# Patient Record
Sex: Male | Born: 1970
Health system: Southern US, Community
[De-identification: ages and names within clinical notes are randomized; demographics above are authoritative.]

## PROBLEM LIST (undated history)

## (undated) DIAGNOSIS — I639 Cerebral infarction, unspecified: Secondary | ICD-10-CM

---

## 2006-06-14 ENCOUNTER — Ambulatory Visit: Payer: Self-pay | Admitting: Podiatry

## 2007-10-20 ENCOUNTER — Emergency Department: Payer: Self-pay | Admitting: Emergency Medicine

## 2008-02-06 ENCOUNTER — Ambulatory Visit: Payer: Self-pay

## 2008-02-25 ENCOUNTER — Ambulatory Visit: Payer: Self-pay

## 2008-11-19 IMAGING — CR RIGHT HAND - COMPLETE 3+ VIEW
1 series · 3 of 3 positions shown · non-contrast
Comparison: none

REASON FOR EXAM: lasting effect from stroke in 5881 dds [id number]
[REDACTED]
COMMENTS:

[Series 1: view not recorded · 0.17mm/px · 3 of 3 slices shown]
[im 1/3]
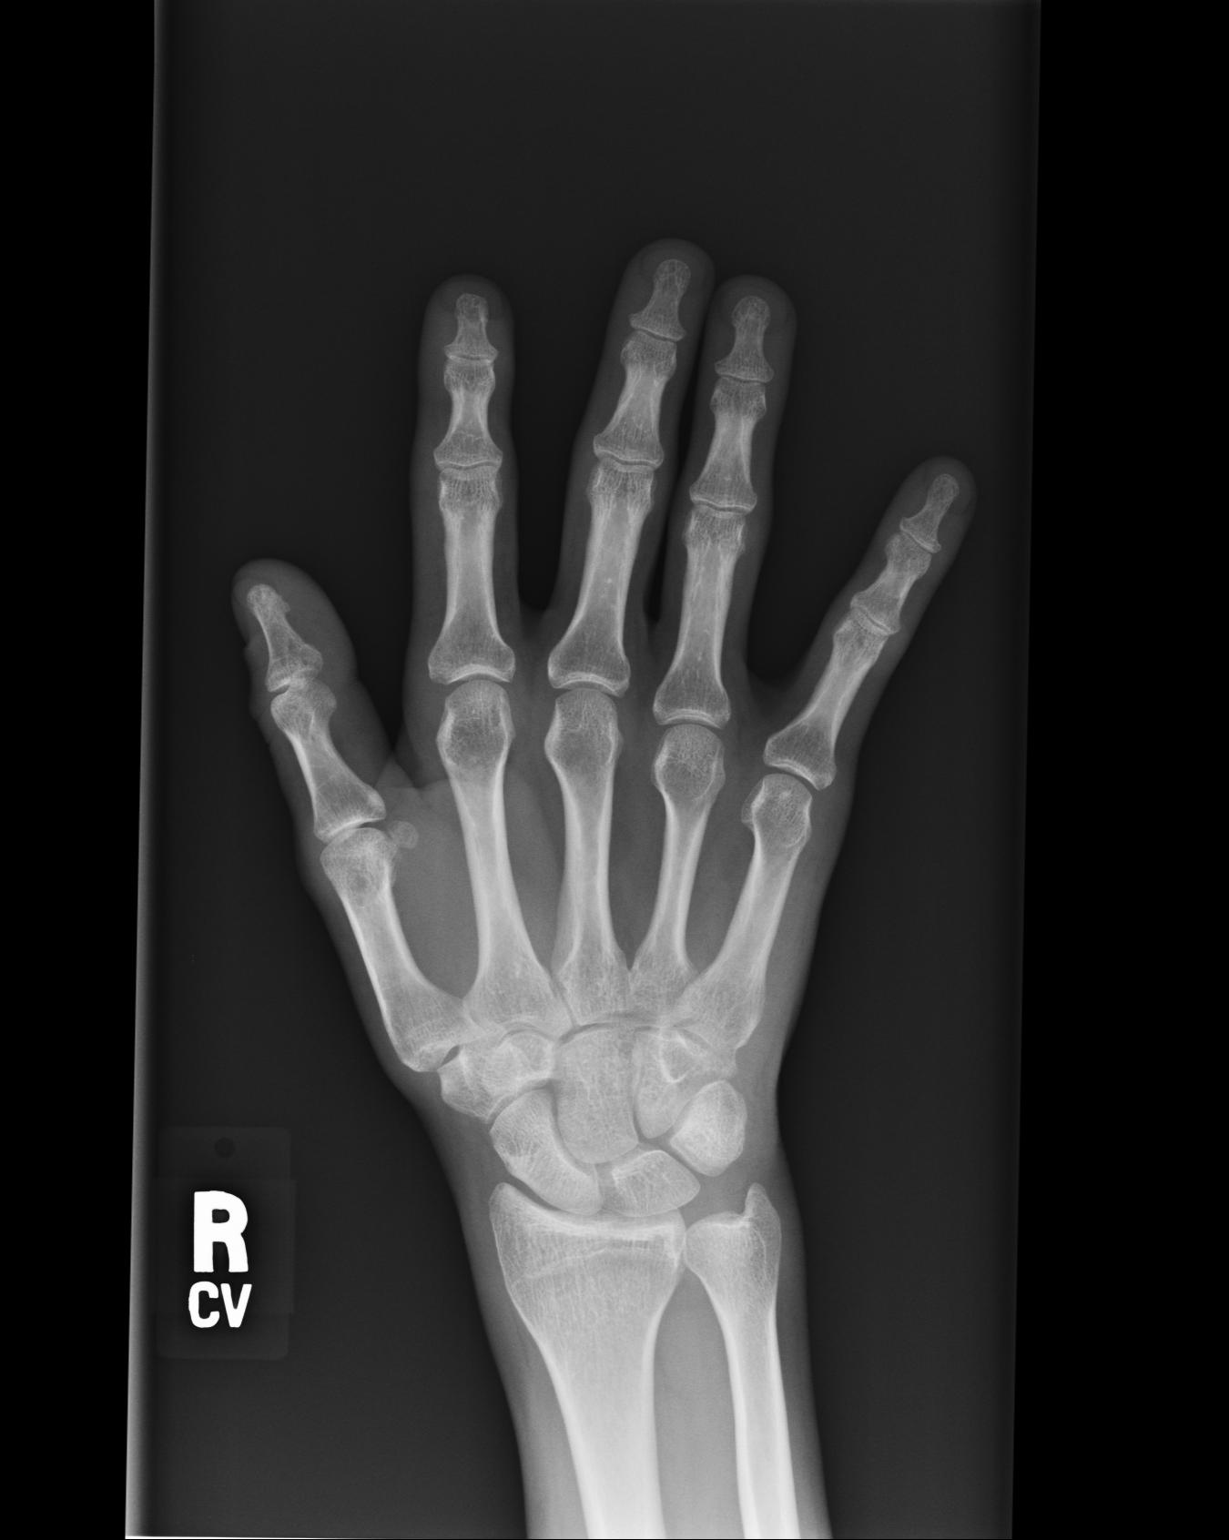
[im 2/3]
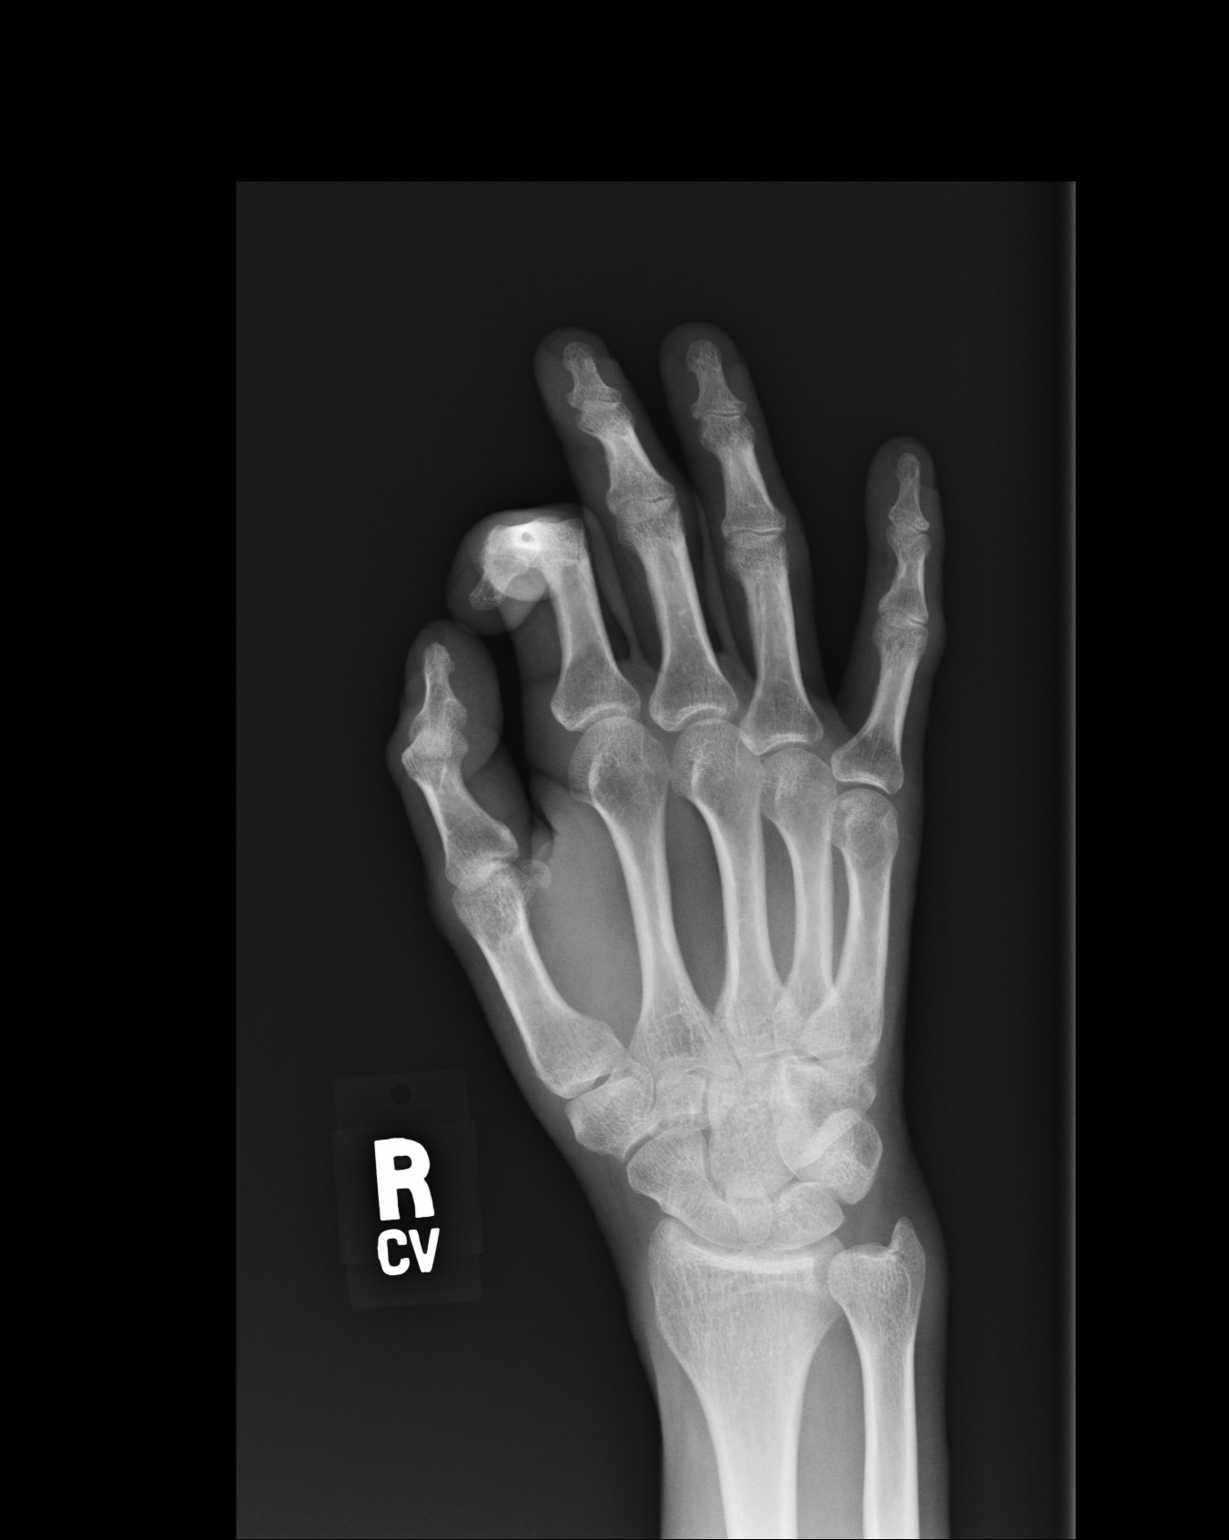
[im 3/3]
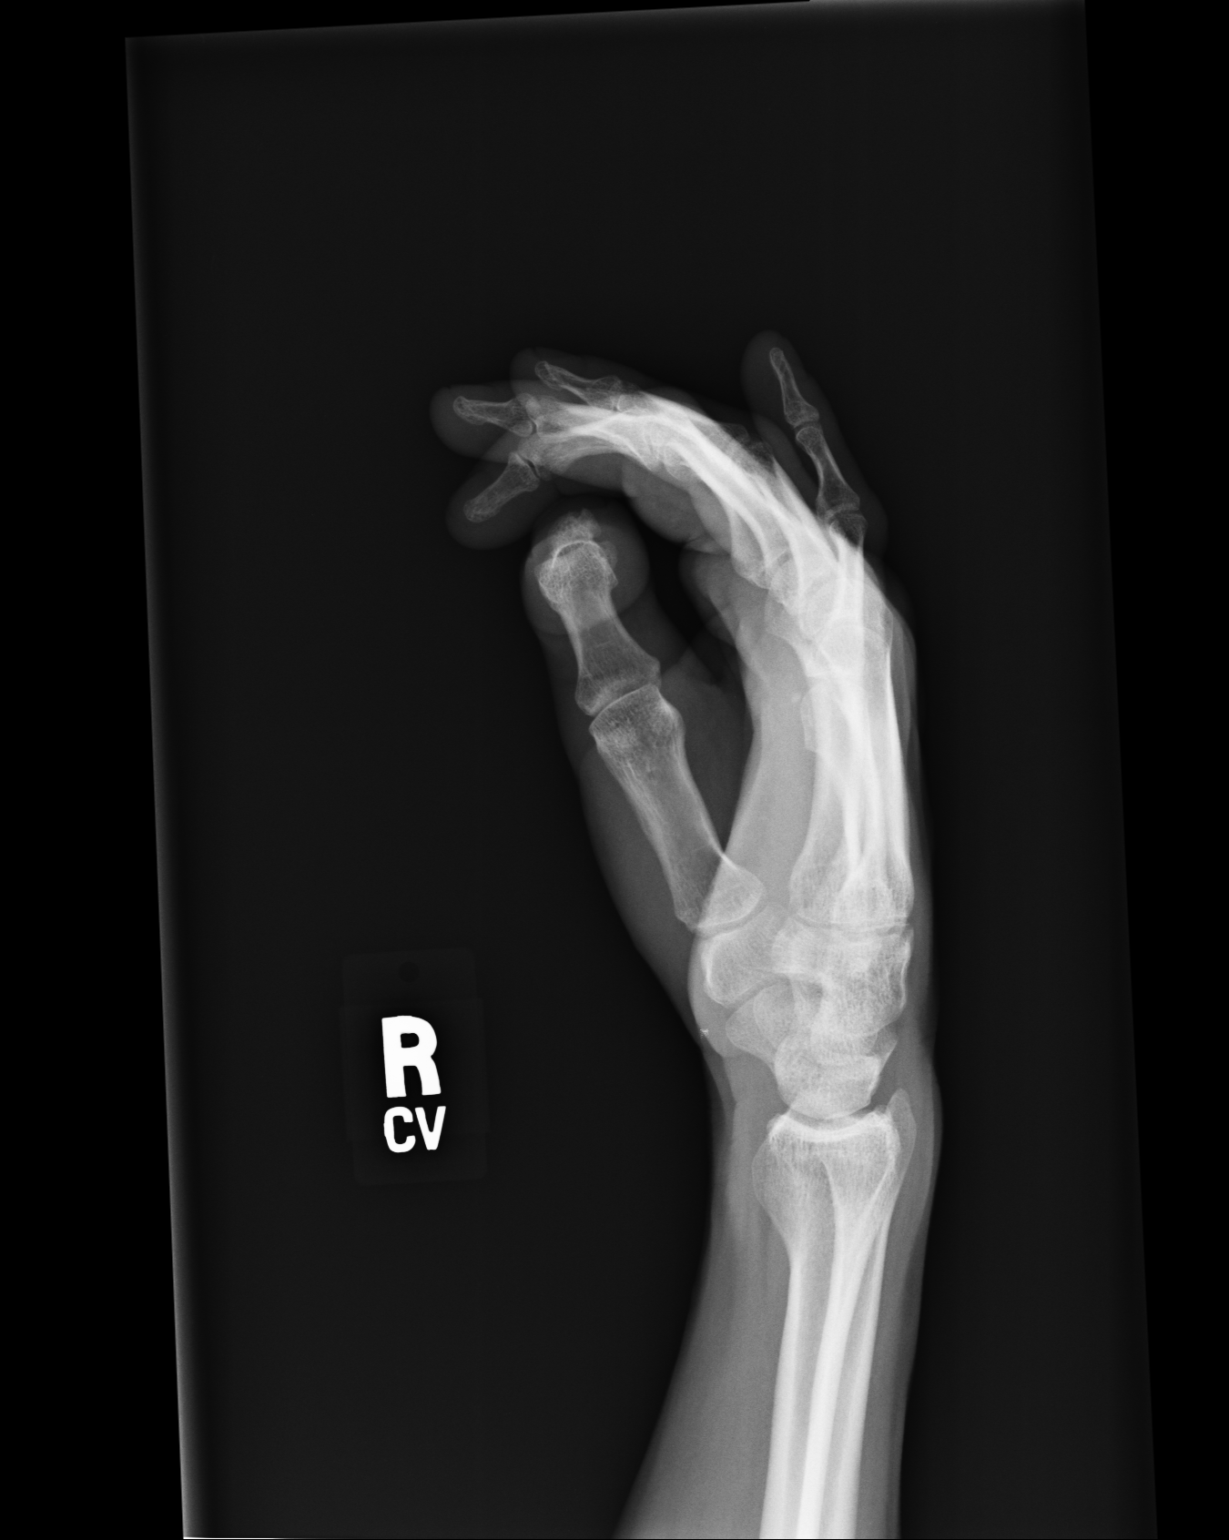

[3 of 3 positions shown; findings below may reference images not displayed]

PROCEDURE:     DXR - DXR HAND RT COMPLETE W/OBLIQUES  - February 25, 2008 [DATE]

RESULT:     Comparison is made to the prior exam of 02/06/2008. No fracture or
dislocation is seen. There is mild narrowing of the DIP and PIP joints
consistent with arthritic change and showing no progression since the prior
exam. No lytic or blastic lesions are noted.
IMPRESSION: 1. No fracture is seen.
2. Arthritic change is noted at the PIP and DIP joints. No progression is
seen as compared to the exam of 02/06/2008.
[DATE]. No lytic or blastic lesions are seen.

## 2008-11-19 IMAGING — CR DG KNEE 1-2V*R*
1 series · 2 of 2 positions shown · non-contrast
Comparison: none

REASON FOR EXAM: effect from stroke in 3521 dds [REDACTED]
[id number]
COMMENTS:

[Series 1: view not recorded · 0.17mm/px · 2 of 2 slices shown]
[im 1/2]
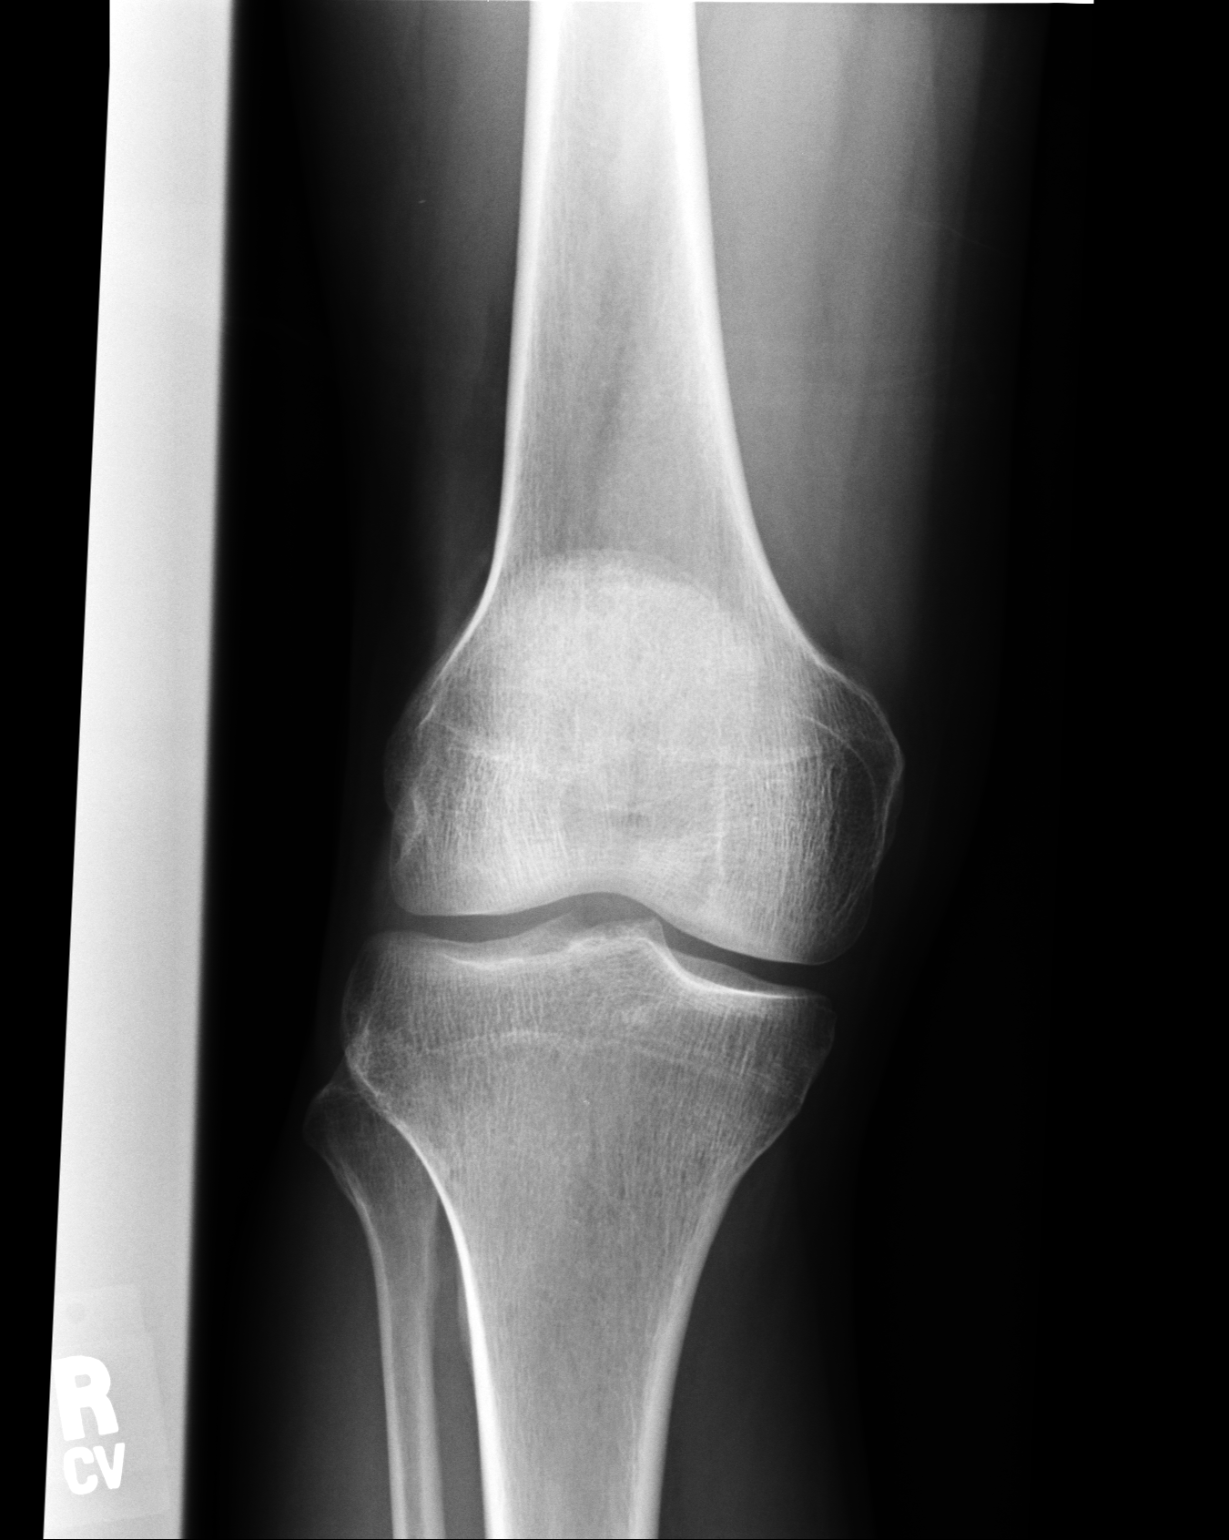
[im 2/2]
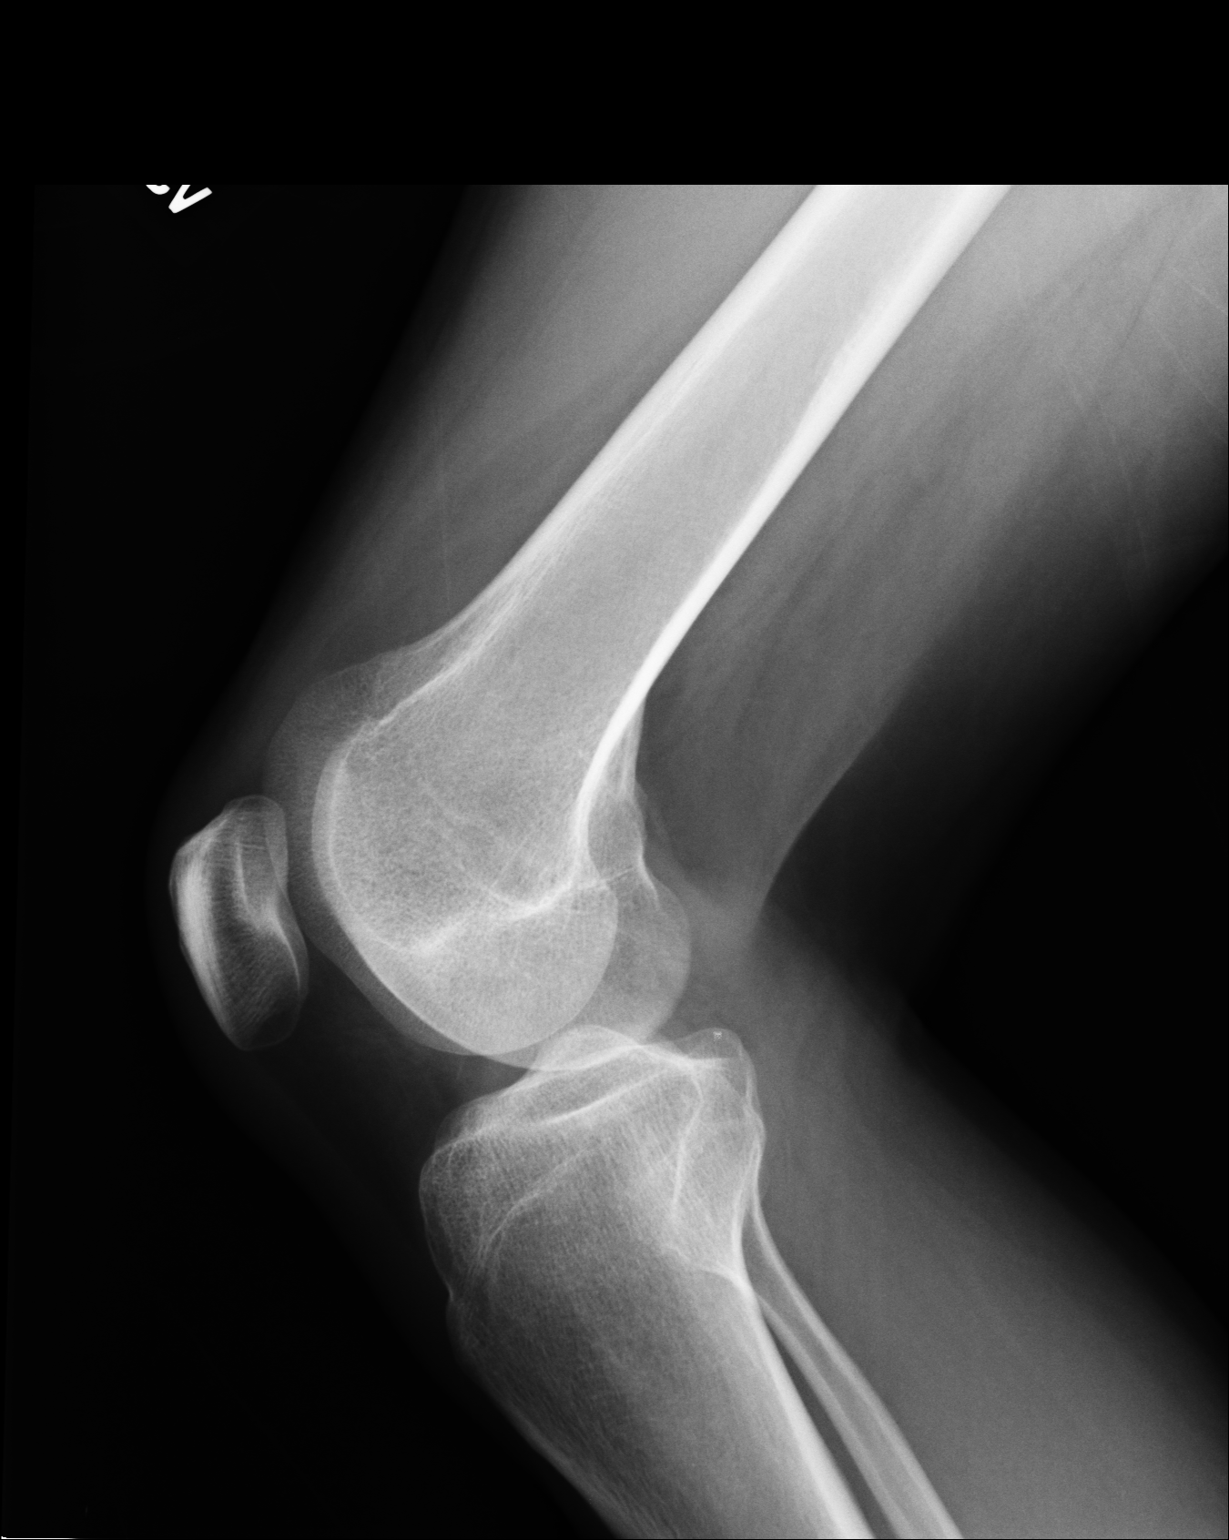

[2 of 2 positions shown; findings below may reference images not displayed]

PROCEDURE:     DXR - DXR KNEE RIGHT AP AND LATERAL  - February 25, 2008 [DATE]

RESULT:     No fracture, dislocation or other acute bony abnormality is
identified. The knee joint space is well maintained. The patella is intact.
Compared to the exam of 02/06/2008, no significant interval changes are seen.
IMPRESSION: No significant abnormalities are identified.

## 2009-07-01 ENCOUNTER — Encounter: Payer: Self-pay | Admitting: Internal Medicine

## 2009-07-08 ENCOUNTER — Encounter: Payer: Self-pay | Admitting: Internal Medicine

## 2012-12-17 ENCOUNTER — Observation Stay: Payer: Self-pay | Admitting: Internal Medicine

## 2012-12-17 LAB — CK TOTAL AND CKMB (NOT AT ARMC)
CK, Total: 154 U/L (ref 35–232)
CK-MB: 2.1 ng/mL (ref 0.5–3.6)

## 2012-12-17 LAB — BASIC METABOLIC PANEL
Anion Gap: 6 — ABNORMAL LOW (ref 7–16)
Calcium, Total: 9.1 mg/dL (ref 8.5–10.1)
Co2: 26 mmol/L (ref 21–32)
Creatinine: 0.83 mg/dL (ref 0.60–1.30)
EGFR (African American): >60
Osmolality: 276 (ref 275–301)
Potassium: 3.8 mmol/L (ref 3.5–5.1)
Sodium: 138 mmol/L (ref 136–145)

## 2012-12-17 LAB — CBC
HCT: 44.7 % (ref 40.0–52.0)
MCH: 31.3 pg (ref 26.0–34.0)
MCHC: 34.9 g/dL (ref 32.0–36.0)
MCV: 90 fL (ref 80–100)
Platelet: 213 10*3/uL (ref 150–440)
RBC: 4.99 10*6/uL (ref 4.40–5.90)
WBC: 8.4 10*3/uL (ref 3.8–10.6)

## 2012-12-18 DIAGNOSIS — R079 Chest pain, unspecified: Secondary | ICD-10-CM

## 2012-12-18 LAB — CBC WITH DIFFERENTIAL/PLATELET
Basophil %: 1.3 %
Eosinophil #: 0.4 10*3/uL (ref 0.0–0.7)
HCT: 42.7 % (ref 40.0–52.0)
HGB: 14.7 g/dL (ref 13.0–18.0)
Lymphocyte %: 30.8 %
MCH: 31.2 pg (ref 26.0–34.0)
MCHC: 34.5 g/dL (ref 32.0–36.0)
MCV: 90 fL (ref 80–100)
Monocyte #: 0.6 x10 3/mm (ref 0.2–1.0)
Monocyte %: 7.9 %
Neutrophil %: 54.9 %
RBC: 4.73 10*6/uL (ref 4.40–5.90)
RDW: 13.2 % (ref 11.5–14.5)
WBC: 7.1 10*3/uL (ref 3.8–10.6)

## 2012-12-18 LAB — URINALYSIS, COMPLETE
Bilirubin,UR: NEGATIVE
Blood: NEGATIVE
Glucose,UR: NEGATIVE mg/dL (ref 0–75)
Ketone: NEGATIVE
Ph: 5 (ref 4.5–8.0)
Protein: NEGATIVE
Specific Gravity: 1.054 (ref 1.003–1.030)
Squamous Epithelial: <1
WBC UR: <1 /HPF (ref 0–5)

## 2012-12-18 LAB — CK TOTAL AND CKMB (NOT AT ARMC)
CK, Total: 80 U/L (ref 35–232)
CK-MB: 1 ng/mL (ref 0.5–3.6)
CK-MB: 1.2 ng/mL (ref 0.5–3.6)

## 2012-12-18 LAB — BASIC METABOLIC PANEL
Anion Gap: 8 (ref 7–16)
BUN: 12 mg/dL (ref 7–18)
Co2: 25 mmol/L (ref 21–32)
Creatinine: 0.79 mg/dL (ref 0.60–1.30)
EGFR (African American): >60
Glucose: 97 mg/dL (ref 65–99)
Osmolality: 273 (ref 275–301)
Potassium: 3.8 mmol/L (ref 3.5–5.1)

## 2012-12-18 LAB — TROPONIN I: Troponin-I: <0.02 ng/mL

## 2012-12-18 LAB — LIPID PANEL
Cholesterol: 141 mg/dL (ref 0–200)
HDL Cholesterol: 68 mg/dL — ABNORMAL HIGH (ref 40–60)
Ldl Cholesterol, Calc: 50 mg/dL (ref 0–100)
Triglycerides: 117 mg/dL (ref 0–200)

## 2012-12-18 LAB — TSH: Thyroid Stimulating Horm: 3.09 u[IU]/mL

## 2013-01-14 ENCOUNTER — Emergency Department: Payer: Self-pay | Admitting: Emergency Medicine

## 2014-08-07 ENCOUNTER — Ambulatory Visit: Payer: Medicare Other | Admitting: Internal Medicine

## 2015-02-27 NOTE — H&P (Signed)
PATIENT NAME:  Adam Anderson, Adam Anderson MR#:  [id number] DATE OF BIRTH:  [birth year]  DATE OF ADMISSION:  12/17/2012  PRIMARY CARE PHYSICIAN:Non local  EMERGENCY ROOM PHYSICIAN:  Dr. Darnelle Catalan.   CHIEF COMPLAINT: Chest pain.   HISTORY OF PRESENT ILLNESS: The patient is a 44 year old male patient with history of stroke with residual right-sided weakness, came in because of chest pain. The patient started to have chest pain from right to the left across the chest, started this morning. Denies any nausea or vomiting. No trouble breathing. No cough. No fever. No sweating. The patient states that he also has upper back pain and lower back pain, unable to move the neck.   PAST MEDICAL HISTORY:  Significant for history of stroke.   ALLERGIES: No known allergies.   SOCIAL HISTORY: The patient smokes 1 pack per day for a long time. No alcohol. No drugs.   PAST SURGICAL HISTORY: No operations.   FAMILY HISTORY: No hypertension or diabetes.   MEDICATIONS:  None.  REVIEW OF SYSTEMS: CONSTITUTIONAL:  He has no fever. No fatigue.  EYES: No blurred vision.  EAR, NOSE, THROAT: No tinnitus. No ear pain. No epistaxis. No difficulty swallowing.  CARDIOVASCULAR: The patient does have some chest pain across the chest from right to left, like a bandlike sensation. Not radiating to the arms. Denies any trouble breathing. No palpitations. No syncope.  GASTROINTESTINAL: No nausea. No vomiting. No abdominal pain.   GENITOURINARY: No dysuria.  MUSCULOSKELETAL: Complains of upper back pain. NEUROLOGIC: The patient has history of stroke with residual right-sided weakness.  PSYCHIATRIC:  Somewhat anxious.  PHYSICAL EXAMINATION:  VITAL SIGNS:  Temperature is 98, heart rate 77, blood pressure is 118/58, respirations 18, sats 100% on room air.  GENERAL: Alert, awake, oriented. Slightly anxious.  HEAD AND EYES:  Atraumatic, normocephalic. Pupils equal, reacting to light. Extraocular movements intact.  EAR, NOSE, THROAT: No  tympanic membrane congestion. No turbinate hypertrophy. No oropharyngeal erythema.  NECK: Normal range of motion. No JVD.  No carotid bruits.    CARDIOVASCULAR:  S1, S2  regular.  No murmurs.  LUNGS:  Clear to auscultation. No wheeze. No rales.  ABDOMEN: Soft, nontender, nondistended. Bowel sounds present.  EXTREMITIES: No edema. No cyanosis. No clubbing.  NEUROLOGIC: The patient has slight weakness in the right upper and lower extremities due to previous stroke, otherwise within normal range power and motion on the left side. Speech is clear.   RADIOGRAPHIC AND LABORATORY DATA: Troponin less than 0.02. CT chest and abdomen did not show any pulmonary emboli. WBC 8.4, hemoglobin 15.6, hematocrit 44.7, platelets 213. Electrolytes:  Sodium 138, potassium  3.8, chloride 106, bicarb 26, BUN is 12, creatinine ,0.83. glucose 100. Troponin less than 0.02, CK 154, CPK-MB 2.1. EKG showed sinus brady at around 54 beats per minute.   ASSESSMENT AND PLAN:  A 44 year old male patient with:  1.  Chest pain, sounds like musculoskeletal with chest wall tenderness, but we will rule out myocardial infarction. Admit to hospitalist service on observation. Continue telemetry. CK, troponins  every 8hrs two more times with Lexiscan stress test in the morning. Continue aspirin, beta blockers, nitroglycerin. Obtain fasting lipids.  2.  Tobacco abuse. I counseled the patient for about 5 minutes about smoking cessation, and the patient not really interested at this time.  3.  Upper back pain, likely musculoskeletal. Continue ibuprofen and also Skelaxin and see how it helps.   TIME SPENT: About 50 minutes on history and physical.    ____________________________ Madelyn Flavors  Luberta MutterKonidena, MD sk:dm D: 12/17/2012 22:00:00 ET T: 12/17/2012 22:28:52 ET JOB#: [id number]  cc: Katha HammingSnehalatha Khalia Gong, MD, <Dictator> Katha HammingSNEHALATHA Natarsha Hurwitz MD ELECTRONICALLY SIGNED 01/08/2013 13:33

## 2015-02-27 NOTE — Discharge Summary (Signed)
PATIENT NAME:  Adam Anderson, Adam Anderson MR#:  [id number] DATE OF BIRTH:  1970-12-18  DATE OF ADMISSION:  12/17/2012 DATE OF DISCHARGE:  12/18/2012  ADMITTING DIAGNOSIS: Chest pain.   DISCHARGE DIAGNOSES:  1. Chest pain of unclear etiology at this time, likely noncardiac. Myoview is negative.  2. Back pain, likely musculoskeletal.  3. Hyperglycemia, resolved.  4. Tobacco abuse.  5. History of stroke with right-sided weakness.   DISCHARGE CONDITION: Stable.   DISCHARGE MEDICATIONS: The patient is to start acetaminophen/oxycodone 325/5 mg 1 tablet every 4 hours as needed, aspirin 81 mg p.o. daily, Skelaxin 800 mg p.o. every 8 hours. He is not to take Bactrim.   HOME OXYGEN: None.   DIET: A 2 gram salt, low fat, low cholesterol, regular consistency.   ACTIVITY LIMITATIONS: As tolerated.   FOLLOWUP APPOINTMENT: With Dr. Juel Burrow in 2 days after discharge.    CONSULTANTS: None.   RADIOLOGIC STUDIES: Chest x-ray, PA and lateral, 12/17/2012 showed no acute cardiopulmonary disease. CT scan of chest, abdomen, as well as pelvis with contrast 12/17/2012 revealed no acute cardiopulmonary disease. No infiltrate, effusion or pneumothorax. No evidence of pulmonary embolus or filling defect. No thoracic aortic aneurysm or dissection. Minimal nodularity in the superior region in the lateral segment of the right middle lobe was noted. Myoview stress test, read by Dr. Mariah Milling, negative for coronary ischemia.   HISTORY OF PRESENT ILLNESS: The patient is a 44 year old Caucasian male with past medical history significant for history of stroke in the past, history of tobacco abuse ongoing who presented to the hospital on 12/17/2012 with complaints of chest pains. Please refer to Dr. Suzanne Boron admission note on 12/17/2012.   PHYSICAL EXAMINATION: On arrival to the Emergency Room, the patient's temperature was 98, pulse was 77, respiratory rate was 18, blood pressure was 118/58, oxygen saturation was 100% on room air.  Physical exam was remarkable for weakness in the right upper extremity as well as right lower extremity due to previous stroke. Otherwise, no significant abnormalities.   LABORATORY DATA: The patient's EKG done in the Emergency Room showed sinus brady at around 54 beats per minute. No acute ST-T changes were noted. The patient's lab data on 12/17/2012 showed elevated glucose to 100, otherwise BMP was unremarkable. The patient's liver enzymes were not checked. The patient's cardiac enzymes x4 did not show any abnormalities. TSH was normal at 3.09. The patient's white blood cell count was normal at 8.4, hemoglobin was 15.6, platelet count 213. D-dimer was less than 0.22. Urinalysis: Amber hazy urine, negative for glucose, bilirubin or ketones, specific gravity was 1.054, pH was 5.0, negative for blood, protein, nitrites or leukocyte esterase, 7 red blood cells, less than 1 white blood cell, no bacteria, less than 1 epithelial cell. No mucus or calcium oxalate crystals were present.   HOSPITAL COURSE:  1. The patient was admitted to the hospital for further evaluation. He was started on some pain medications, opiates, for his pain and stress test of the heart was ordered for 12/18/2012. The patient underwent cardiac stress test procedure on February 11th, Lexiscan Myoview. The patient's baseline EKG revealed right bundle branch block with no significant ST or T wave abnormalities. The patient had no chest pains during excercise , and his Myoview images were normal.  2. The patient was noted to have back pains. It was felt that the patient's back pain likely was musculoskeletal. CT scan of his chest showed no significant abnormalities, bony or lung abnormalities. The patient was advised to continue Skelaxin  as well as Percocet as needed.  3. Opiates. The patient was noted to be bradycardic. TSH was checked and was found to be within normal limits. The patient had no abnormalities in his blood pressure during even  significant bradycardia with heart rate of 45. It was felt that the patient is to continue to follow up with his primary care physician for further recommendations.  4. The patient was noted to be hyperglycemic; however, fasting blood glucose level was below 100.  5. For tobacco abuse, the patient was counseled; however, he refused any tobacco replacement therapy.  6. For history of stroke, no significant abnormalities where seen. The patient's right-sided weakness was stable, chronic.   The patient is being discharged in stable condition with the above-mentioned medications and followup. Vital signs of the day of discharge: Temperature was 97.4, pulse was ranging from 47 to 80s, respiratory rate was 18, blood pressure was 114/75, saturation was 99% on room air at rest.   TIME SPENT: 40 minutes.    ____________________________ Katharina Caperima Steele Stracener, MD rv:gb D: 12/18/2012 17:41:19 ET T: 12/19/2012 00:36:20 ET JOB#: [id number]  cc: Katharina Caperima Lasonia Casino, MD, <Dictator> Corky DownsJaved Masoud, MD Kaizer Dissinger MD ELECTRONICALLY SIGNED 12/29/2012 20:42

## 2015-05-13 ENCOUNTER — Encounter: Payer: Self-pay | Admitting: Emergency Medicine

## 2015-05-13 ENCOUNTER — Emergency Department
Admission: EM | Admit: 2015-05-13 | Discharge: 2015-05-13 | Disposition: A | Discharge status: Home / Self Care | Payer: Medicare HMO | Attending: Emergency Medicine | Admitting: Emergency Medicine

## 2015-05-13 ENCOUNTER — Emergency Department: Payer: Medicare HMO

## 2015-05-13 DIAGNOSIS — Y288XXA Contact with other sharp object, undetermined intent, initial encounter: Secondary | ICD-10-CM | POA: Insufficient documentation

## 2015-05-13 DIAGNOSIS — Y998 Other external cause status: Secondary | ICD-10-CM | POA: Diagnosis not present

## 2015-05-13 DIAGNOSIS — S91115A Laceration without foreign body of left lesser toe(s) without damage to nail, initial encounter: Secondary | ICD-10-CM | POA: Diagnosis not present

## 2015-05-13 DIAGNOSIS — Y9289 Other specified places as the place of occurrence of the external cause: Secondary | ICD-10-CM | POA: Diagnosis not present

## 2015-05-13 DIAGNOSIS — Y9389 Activity, other specified: Secondary | ICD-10-CM | POA: Diagnosis not present

## 2015-05-13 DIAGNOSIS — Z720 Tobacco use: Secondary | ICD-10-CM | POA: Diagnosis not present

## 2015-05-13 DIAGNOSIS — S81812A Laceration without foreign body, left lower leg, initial encounter: Secondary | ICD-10-CM | POA: Diagnosis not present

## 2015-05-13 DIAGNOSIS — S99922A Unspecified injury of left foot, initial encounter: Secondary | ICD-10-CM | POA: Diagnosis present

## 2015-05-13 MED ORDER — CLOTRIMAZOLE 1 % EX CREA
TOPICAL_CREAM | Freq: Once | CUTANEOUS | Status: DC
  Filled 2015-05-13: qty 15

## 2015-05-13 MED ORDER — TETANUS-DIPHTH-ACELL PERTUSSIS 5-2.5-18.5 LF-MCG/0.5 IM SUSP
INTRAMUSCULAR | Status: AC
  Administered 2015-05-13: 0.5 mL via INTRAMUSCULAR
  Filled 2015-05-13: qty 0.5

## 2015-05-13 MED ORDER — TETANUS-DIPHTH-ACELL PERTUSSIS 5-2.5-18.5 LF-MCG/0.5 IM SUSP
0.5000 mL | Freq: Once | INTRAMUSCULAR | Status: AC
Start: 2015-05-13 — End: 2015-05-13
  Administered 2015-05-13: 0.5 mL via INTRAMUSCULAR

## 2015-05-13 MED ORDER — OXYCODONE-ACETAMINOPHEN 7.5-325 MG PO TABS
1.0000 | ORAL_TABLET | Freq: Four times a day (QID) | ORAL | Status: DC | PRN
Start: 2015-05-13 — End: 2018-09-19

## 2015-05-13 MED ORDER — BACITRACIN ZINC 500 UNIT/GM EX OINT
TOPICAL_OINTMENT | CUTANEOUS | Status: AC
  Filled 2015-05-13: qty 1.8

## 2015-05-13 MED ORDER — MICONAZOLE NITRATE 2 % EX CREA
TOPICAL_CREAM | Freq: Once | CUTANEOUS | Status: DC
  Filled 2015-05-13: qty 14

## 2015-05-13 MED ORDER — LIDOCAINE-EPINEPHRINE (PF) 1 %-1:200000 IJ SOLN
INTRAMUSCULAR | Status: AC
  Administered 2015-05-13: 30 mL
  Filled 2015-05-13: qty 30

## 2015-05-13 MED ORDER — LIDOCAINE HCL (PF) 1 % IJ SOLN
INTRAMUSCULAR | Status: AC
  Filled 2015-05-13: qty 5

## 2015-05-13 MED ORDER — OXYCODONE-ACETAMINOPHEN 5-325 MG PO TABS
1.0000 | ORAL_TABLET | Freq: Once | ORAL | Status: AC
  Administered 2015-05-13: 1 via ORAL

## 2015-05-13 MED ORDER — LIDOCAINE HCL (PF) 1 % IJ SOLN
INTRAMUSCULAR | Status: AC
  Administered 2015-05-13: 5 mL
  Filled 2015-05-13: qty 5

## 2015-05-13 NOTE — ED Notes (Signed)
Pt with laceration to left lower leg and toe on left foot with saw.

## 2015-05-13 NOTE — ED Provider Notes (Signed)
[facility] Emergency Department Provider Note  ____________________________________________  Time seen: Approximately 9:17 AM  I have reviewed the triage vital signs and the nursing notes.   HISTORY  Chief Complaint Extremity Laceration    HPI Adam Anderson is a 44 y.o. male she will for lacerations the left lower leg in the third toe on the left foot. Patient stated there is a metal cut by a drill bit. Patient state hemorrhage is controlled direct pressure. Patient denies any loss sensation or loss of function of the left lower extremity. Patient rated his pain as a 5/10 describe the sharp. Except for pressure to control of wound bleeding no palliative measures performed.   History reviewed. No pertinent past medical history.  There are no active problems to display for this patient.   History reviewed. No pertinent past surgical history.  No current outpatient prescriptions on file.  Allergies Review of patient's allergies indicates no known allergies.  No family history on file.  Social History History  Substance Use Topics  . Smoking status: Current Every Day Smoker  . Smokeless tobacco: Not on file  . Alcohol Use: Yes    Review of Systems Constitutional: No fever/chills Eyes: No visual changes. ENT: No sore throat. Cardiovascular: Denies chest pain. Respiratory: Denies shortness of breath. Gastrointestinal: No abdominal pain.  No nausea, no vomiting.  No diarrhea.  No constipation. Genitourinary: Negative for dysuria. Musculoskeletal: Negative for back pain. Skin: Negative for rash. Laceration to the left leg and the third toe left foot. Neurological: Negative for headaches, focal weakness or numbness. 10-point ROS otherwise negative.  ____________________________________________   PHYSICAL EXAM:  VITAL SIGNS: ED Triage Vitals  Enc Vitals Group     BP 05/13/15 0912 131/72 mmHg     Pulse Rate 05/13/15 0912 75     Resp  05/13/15 0912 22     Temp 05/13/15 0912 97.8 F (36.6 C)     Temp Source 05/13/15 0912 Oral     SpO2 05/13/15 0912 100 %     Weight 05/13/15 0910 132 lb (59.875 kg)     Height 05/13/15 0910  (1.727 m)     Head Cir --      Peak Flow --      Pain Score 05/13/15 0911 5     Pain Loc --      Pain Edu? --      Excl. in GC? --     Constitutional: Alert and oriented. Well appearing and in no acute distress. Eyes: Conjunctivae are normal. PERRL. EOMI. Head: Atraumatic. Nose: No congestion/rhinnorhea. Mouth/Throat: Mucous membranes are moist.  Oropharynx non-erythematous. Neck: No stridor.  No cervical spine tenderness to palpation. Hematological/Lymphatic/Immunilogical: No cervical lymphadenopathy. Cardiovascular: Normal rate, regular rhythm. Grossly normal heart sounds.  Good peripheral circulation. Respiratory: Normal respiratory effort.  No retractions. Lungs CTAB. Gastrointestinal: Soft and nontender. No distention. No abdominal bruits. No CVA tenderness. Musculoskeletal: No lower extremity tenderness nor edema.  No joint effusions. Neurologic:  Normal speech and language. No gross focal neurologic deficits are appreciated. Speech is normal. No gait instability. Skin:  Skin is warm, dry and intact. No rash noted. 3 cm laceration to the left lower leg. 3 cm laceration to the third digit left foot on the dorsal aspect. Hemorrhages is controlled. No nuchal range of motion of the extremities sensation is intact. Psychiatric: Mood and affect are normal. Speech and behavior are normal.  ____________________________________________   LABS (all labs ordered are listed, but only abnormal  results are displayed)  Labs Reviewed - No data to display ____________________________________________  EKG   ____________________________________________  RADIOLOGY  No acute finding on x-ray. ____________________________________________   PROCEDURES  Procedure(s) performed: See procedure  note  Critical Care performed: No  ___________LACERATION REPAIR Performed by: Joni Reiningonald K Mikhael Hendriks Authorized by: Joni Reiningonald K Fronie Holstein Consent: Verbal consent obtained. Risks and benefits: risks, benefits and alternatives were discussed Consent given by: patient Patient identity confirmed: provided demographic data Prepped and Draped in normal sterile fashion Wound explored  Laceration Location: Left lower leg and third digit dorsal aspect left foot.  Laceration Length: Total 6 cm   No Foreign Bodies seen or palpated  Anesthesia: local infiltration for the left lower leg in a digital block to the third digit left foot.   Local anesthetic: Lidocaine 1% with epinephrine was used in the left lower leg. Lidocaine 1% without epinephrine was used for digital block.   Anesthetic total: 6 mL of lidocaine epinephrine was used left lower leg. And a total of 6 mL of lidocaine without epinephrine was used for digital block. Irrigation method: syringe Amount of cleaning: standard  Skin closure: 3-0 nylon was used for the left lower leg. 40 proline was use for the left toe. Number of sutures: 8 sutures were used for the left leg. 8 sutures were used for the toe. Total 16   Technique: Interrupted Patient tolerance: Patient tolerated the procedure well with no immediate complications. _________________________________   INITIAL IMPRESSION / ASSESSMENT AND PLAN / ED COURSE  Pertinent labs & imaging results that were available during my care of the patient were reviewed by me and considered in my medical decision making (see chart for details). Laceration to the left lower leg and also lacerates the dose aspect of the third digit left foot. Wounds were sutured and patient given advice on home care. Patient advised return by ER 10 days for suture removal. ____________________________________________   FINAL CLINICAL IMPRESSION(S) / ED DIAGNOSES  Final diagnoses:  Leg laceration, left, initial  encounter  Laceration of third toe, left, initial encounter      Joni ReiningRonald K Lillyana Majette, PA-C 05/13/15 [address]1051  Anyi Fels K Deshon Koslowski, PA-C 05/13/15 1147  Sharman CheekPhillip Stafford, MD 05/13/15 [phone]

## 2016-01-06 DIAGNOSIS — L700 Acne vulgaris: Secondary | ICD-10-CM | POA: Diagnosis not present

## 2016-03-08 DIAGNOSIS — R079 Chest pain, unspecified: Secondary | ICD-10-CM | POA: Diagnosis not present

## 2016-03-08 DIAGNOSIS — I6990 Unspecified sequelae of unspecified cerebrovascular disease: Secondary | ICD-10-CM | POA: Diagnosis not present

## 2016-03-09 DIAGNOSIS — A64 Unspecified sexually transmitted disease: Secondary | ICD-10-CM | POA: Diagnosis not present

## 2016-03-09 DIAGNOSIS — E784 Other hyperlipidemia: Secondary | ICD-10-CM | POA: Diagnosis not present

## 2016-03-09 DIAGNOSIS — R5381 Other malaise: Secondary | ICD-10-CM | POA: Diagnosis not present

## 2016-03-09 DIAGNOSIS — I10 Essential (primary) hypertension: Secondary | ICD-10-CM | POA: Diagnosis not present

## 2016-03-14 DIAGNOSIS — R079 Chest pain, unspecified: Secondary | ICD-10-CM | POA: Diagnosis not present

## 2016-03-14 DIAGNOSIS — I6990 Unspecified sequelae of unspecified cerebrovascular disease: Secondary | ICD-10-CM | POA: Diagnosis not present

## 2016-03-22 DIAGNOSIS — M545 Low back pain: Secondary | ICD-10-CM | POA: Diagnosis not present

## 2016-04-22 DIAGNOSIS — M9901 Segmental and somatic dysfunction of cervical region: Secondary | ICD-10-CM | POA: Diagnosis not present

## 2016-04-22 DIAGNOSIS — M62830 Muscle spasm of back: Secondary | ICD-10-CM | POA: Diagnosis not present

## 2016-04-22 DIAGNOSIS — M5412 Radiculopathy, cervical region: Secondary | ICD-10-CM | POA: Diagnosis not present

## 2016-04-22 DIAGNOSIS — M9902 Segmental and somatic dysfunction of thoracic region: Secondary | ICD-10-CM | POA: Diagnosis not present

## 2016-04-25 DIAGNOSIS — M9901 Segmental and somatic dysfunction of cervical region: Secondary | ICD-10-CM | POA: Diagnosis not present

## 2016-04-25 DIAGNOSIS — M9902 Segmental and somatic dysfunction of thoracic region: Secondary | ICD-10-CM | POA: Diagnosis not present

## 2016-04-25 DIAGNOSIS — M5412 Radiculopathy, cervical region: Secondary | ICD-10-CM | POA: Diagnosis not present

## 2016-04-25 DIAGNOSIS — M62830 Muscle spasm of back: Secondary | ICD-10-CM | POA: Diagnosis not present

## 2016-04-27 DIAGNOSIS — M9902 Segmental and somatic dysfunction of thoracic region: Secondary | ICD-10-CM | POA: Diagnosis not present

## 2016-04-27 DIAGNOSIS — M5412 Radiculopathy, cervical region: Secondary | ICD-10-CM | POA: Diagnosis not present

## 2016-04-27 DIAGNOSIS — M62830 Muscle spasm of back: Secondary | ICD-10-CM | POA: Diagnosis not present

## 2016-04-27 DIAGNOSIS — M9901 Segmental and somatic dysfunction of cervical region: Secondary | ICD-10-CM | POA: Diagnosis not present

## 2016-04-28 DIAGNOSIS — M9902 Segmental and somatic dysfunction of thoracic region: Secondary | ICD-10-CM | POA: Diagnosis not present

## 2016-04-28 DIAGNOSIS — M9901 Segmental and somatic dysfunction of cervical region: Secondary | ICD-10-CM | POA: Diagnosis not present

## 2016-04-28 DIAGNOSIS — M5412 Radiculopathy, cervical region: Secondary | ICD-10-CM | POA: Diagnosis not present

## 2016-04-28 DIAGNOSIS — M62830 Muscle spasm of back: Secondary | ICD-10-CM | POA: Diagnosis not present

## 2016-05-02 DIAGNOSIS — M62830 Muscle spasm of back: Secondary | ICD-10-CM | POA: Diagnosis not present

## 2016-05-02 DIAGNOSIS — M9901 Segmental and somatic dysfunction of cervical region: Secondary | ICD-10-CM | POA: Diagnosis not present

## 2016-05-02 DIAGNOSIS — M9902 Segmental and somatic dysfunction of thoracic region: Secondary | ICD-10-CM | POA: Diagnosis not present

## 2016-05-02 DIAGNOSIS — M5412 Radiculopathy, cervical region: Secondary | ICD-10-CM | POA: Diagnosis not present

## 2016-05-04 DIAGNOSIS — M62830 Muscle spasm of back: Secondary | ICD-10-CM | POA: Diagnosis not present

## 2016-05-04 DIAGNOSIS — M9901 Segmental and somatic dysfunction of cervical region: Secondary | ICD-10-CM | POA: Diagnosis not present

## 2016-05-04 DIAGNOSIS — M5412 Radiculopathy, cervical region: Secondary | ICD-10-CM | POA: Diagnosis not present

## 2016-05-04 DIAGNOSIS — M9902 Segmental and somatic dysfunction of thoracic region: Secondary | ICD-10-CM | POA: Diagnosis not present

## 2016-05-12 DIAGNOSIS — M9902 Segmental and somatic dysfunction of thoracic region: Secondary | ICD-10-CM | POA: Diagnosis not present

## 2016-05-12 DIAGNOSIS — M62830 Muscle spasm of back: Secondary | ICD-10-CM | POA: Diagnosis not present

## 2016-05-12 DIAGNOSIS — M9901 Segmental and somatic dysfunction of cervical region: Secondary | ICD-10-CM | POA: Diagnosis not present

## 2016-05-12 DIAGNOSIS — M5412 Radiculopathy, cervical region: Secondary | ICD-10-CM | POA: Diagnosis not present

## 2016-05-13 DIAGNOSIS — M5412 Radiculopathy, cervical region: Secondary | ICD-10-CM | POA: Diagnosis not present

## 2016-05-13 DIAGNOSIS — M9902 Segmental and somatic dysfunction of thoracic region: Secondary | ICD-10-CM | POA: Diagnosis not present

## 2016-05-13 DIAGNOSIS — M9901 Segmental and somatic dysfunction of cervical region: Secondary | ICD-10-CM | POA: Diagnosis not present

## 2016-05-13 DIAGNOSIS — M62830 Muscle spasm of back: Secondary | ICD-10-CM | POA: Diagnosis not present

## 2016-05-16 DIAGNOSIS — M9902 Segmental and somatic dysfunction of thoracic region: Secondary | ICD-10-CM | POA: Diagnosis not present

## 2016-05-16 DIAGNOSIS — M9901 Segmental and somatic dysfunction of cervical region: Secondary | ICD-10-CM | POA: Diagnosis not present

## 2016-05-16 DIAGNOSIS — M5412 Radiculopathy, cervical region: Secondary | ICD-10-CM | POA: Diagnosis not present

## 2016-05-16 DIAGNOSIS — M62830 Muscle spasm of back: Secondary | ICD-10-CM | POA: Diagnosis not present

## 2016-05-18 DIAGNOSIS — M9901 Segmental and somatic dysfunction of cervical region: Secondary | ICD-10-CM | POA: Diagnosis not present

## 2016-05-18 DIAGNOSIS — M62830 Muscle spasm of back: Secondary | ICD-10-CM | POA: Diagnosis not present

## 2016-05-18 DIAGNOSIS — M5412 Radiculopathy, cervical region: Secondary | ICD-10-CM | POA: Diagnosis not present

## 2016-05-18 DIAGNOSIS — M9902 Segmental and somatic dysfunction of thoracic region: Secondary | ICD-10-CM | POA: Diagnosis not present

## 2016-05-19 DIAGNOSIS — M62830 Muscle spasm of back: Secondary | ICD-10-CM | POA: Diagnosis not present

## 2016-05-19 DIAGNOSIS — M9902 Segmental and somatic dysfunction of thoracic region: Secondary | ICD-10-CM | POA: Diagnosis not present

## 2016-05-19 DIAGNOSIS — M5412 Radiculopathy, cervical region: Secondary | ICD-10-CM | POA: Diagnosis not present

## 2016-05-19 DIAGNOSIS — M9901 Segmental and somatic dysfunction of cervical region: Secondary | ICD-10-CM | POA: Diagnosis not present

## 2016-05-24 DIAGNOSIS — M9902 Segmental and somatic dysfunction of thoracic region: Secondary | ICD-10-CM | POA: Diagnosis not present

## 2016-05-24 DIAGNOSIS — M5412 Radiculopathy, cervical region: Secondary | ICD-10-CM | POA: Diagnosis not present

## 2016-05-24 DIAGNOSIS — M9901 Segmental and somatic dysfunction of cervical region: Secondary | ICD-10-CM | POA: Diagnosis not present

## 2016-05-24 DIAGNOSIS — M62830 Muscle spasm of back: Secondary | ICD-10-CM | POA: Diagnosis not present

## 2016-05-26 DIAGNOSIS — M9901 Segmental and somatic dysfunction of cervical region: Secondary | ICD-10-CM | POA: Diagnosis not present

## 2016-05-26 DIAGNOSIS — M5412 Radiculopathy, cervical region: Secondary | ICD-10-CM | POA: Diagnosis not present

## 2016-05-26 DIAGNOSIS — M62830 Muscle spasm of back: Secondary | ICD-10-CM | POA: Diagnosis not present

## 2016-05-26 DIAGNOSIS — M9902 Segmental and somatic dysfunction of thoracic region: Secondary | ICD-10-CM | POA: Diagnosis not present

## 2016-06-02 DIAGNOSIS — M62830 Muscle spasm of back: Secondary | ICD-10-CM | POA: Diagnosis not present

## 2016-06-02 DIAGNOSIS — M9902 Segmental and somatic dysfunction of thoracic region: Secondary | ICD-10-CM | POA: Diagnosis not present

## 2016-06-02 DIAGNOSIS — M9901 Segmental and somatic dysfunction of cervical region: Secondary | ICD-10-CM | POA: Diagnosis not present

## 2016-06-02 DIAGNOSIS — M5412 Radiculopathy, cervical region: Secondary | ICD-10-CM | POA: Diagnosis not present

## 2016-06-07 DIAGNOSIS — M9902 Segmental and somatic dysfunction of thoracic region: Secondary | ICD-10-CM | POA: Diagnosis not present

## 2016-06-07 DIAGNOSIS — M5412 Radiculopathy, cervical region: Secondary | ICD-10-CM | POA: Diagnosis not present

## 2016-06-07 DIAGNOSIS — M9901 Segmental and somatic dysfunction of cervical region: Secondary | ICD-10-CM | POA: Diagnosis not present

## 2016-06-07 DIAGNOSIS — M62830 Muscle spasm of back: Secondary | ICD-10-CM | POA: Diagnosis not present

## 2016-06-09 DIAGNOSIS — M9902 Segmental and somatic dysfunction of thoracic region: Secondary | ICD-10-CM | POA: Diagnosis not present

## 2016-06-09 DIAGNOSIS — M5412 Radiculopathy, cervical region: Secondary | ICD-10-CM | POA: Diagnosis not present

## 2016-06-09 DIAGNOSIS — M9901 Segmental and somatic dysfunction of cervical region: Secondary | ICD-10-CM | POA: Diagnosis not present

## 2016-06-09 DIAGNOSIS — M62830 Muscle spasm of back: Secondary | ICD-10-CM | POA: Diagnosis not present

## 2016-06-14 DIAGNOSIS — M9902 Segmental and somatic dysfunction of thoracic region: Secondary | ICD-10-CM | POA: Diagnosis not present

## 2016-06-14 DIAGNOSIS — M5412 Radiculopathy, cervical region: Secondary | ICD-10-CM | POA: Diagnosis not present

## 2016-06-14 DIAGNOSIS — M9901 Segmental and somatic dysfunction of cervical region: Secondary | ICD-10-CM | POA: Diagnosis not present

## 2016-06-14 DIAGNOSIS — M62830 Muscle spasm of back: Secondary | ICD-10-CM | POA: Diagnosis not present

## 2016-06-16 DIAGNOSIS — M9901 Segmental and somatic dysfunction of cervical region: Secondary | ICD-10-CM | POA: Diagnosis not present

## 2016-06-16 DIAGNOSIS — M62830 Muscle spasm of back: Secondary | ICD-10-CM | POA: Diagnosis not present

## 2016-06-16 DIAGNOSIS — M5412 Radiculopathy, cervical region: Secondary | ICD-10-CM | POA: Diagnosis not present

## 2016-06-16 DIAGNOSIS — M9902 Segmental and somatic dysfunction of thoracic region: Secondary | ICD-10-CM | POA: Diagnosis not present

## 2016-06-21 DIAGNOSIS — M9902 Segmental and somatic dysfunction of thoracic region: Secondary | ICD-10-CM | POA: Diagnosis not present

## 2016-06-21 DIAGNOSIS — M9901 Segmental and somatic dysfunction of cervical region: Secondary | ICD-10-CM | POA: Diagnosis not present

## 2016-06-21 DIAGNOSIS — M62830 Muscle spasm of back: Secondary | ICD-10-CM | POA: Diagnosis not present

## 2016-06-21 DIAGNOSIS — M5412 Radiculopathy, cervical region: Secondary | ICD-10-CM | POA: Diagnosis not present

## 2016-06-23 DIAGNOSIS — M5412 Radiculopathy, cervical region: Secondary | ICD-10-CM | POA: Diagnosis not present

## 2016-06-23 DIAGNOSIS — M62830 Muscle spasm of back: Secondary | ICD-10-CM | POA: Diagnosis not present

## 2016-06-23 DIAGNOSIS — M9902 Segmental and somatic dysfunction of thoracic region: Secondary | ICD-10-CM | POA: Diagnosis not present

## 2016-06-23 DIAGNOSIS — M9901 Segmental and somatic dysfunction of cervical region: Secondary | ICD-10-CM | POA: Diagnosis not present

## 2016-06-29 DIAGNOSIS — M9902 Segmental and somatic dysfunction of thoracic region: Secondary | ICD-10-CM | POA: Diagnosis not present

## 2016-06-29 DIAGNOSIS — M9901 Segmental and somatic dysfunction of cervical region: Secondary | ICD-10-CM | POA: Diagnosis not present

## 2016-06-29 DIAGNOSIS — M62830 Muscle spasm of back: Secondary | ICD-10-CM | POA: Diagnosis not present

## 2016-06-29 DIAGNOSIS — M5412 Radiculopathy, cervical region: Secondary | ICD-10-CM | POA: Diagnosis not present

## 2016-07-06 DIAGNOSIS — M5412 Radiculopathy, cervical region: Secondary | ICD-10-CM | POA: Diagnosis not present

## 2016-07-06 DIAGNOSIS — M9901 Segmental and somatic dysfunction of cervical region: Secondary | ICD-10-CM | POA: Diagnosis not present

## 2016-07-06 DIAGNOSIS — M9902 Segmental and somatic dysfunction of thoracic region: Secondary | ICD-10-CM | POA: Diagnosis not present

## 2016-07-06 DIAGNOSIS — M62830 Muscle spasm of back: Secondary | ICD-10-CM | POA: Diagnosis not present

## 2017-04-01 ENCOUNTER — Encounter: Payer: Self-pay | Admitting: Emergency Medicine

## 2017-04-01 ENCOUNTER — Emergency Department: Payer: PPO

## 2017-04-01 ENCOUNTER — Emergency Department
Admission: EM | Admit: 2017-04-01 | Discharge: 2017-04-01 | Disposition: A | Discharge status: Home / Self Care | Payer: PPO | Attending: Emergency Medicine | Admitting: Emergency Medicine

## 2017-04-01 DIAGNOSIS — F17200 Nicotine dependence, unspecified, uncomplicated: Secondary | ICD-10-CM | POA: Insufficient documentation

## 2017-04-01 DIAGNOSIS — Y998 Other external cause status: Secondary | ICD-10-CM | POA: Insufficient documentation

## 2017-04-01 DIAGNOSIS — S62316A Displaced fracture of base of fifth metacarpal bone, right hand, initial encounter for closed fracture: Secondary | ICD-10-CM | POA: Diagnosis not present

## 2017-04-01 DIAGNOSIS — Y929 Unspecified place or not applicable: Secondary | ICD-10-CM | POA: Insufficient documentation

## 2017-04-01 DIAGNOSIS — S6992XA Unspecified injury of left wrist, hand and finger(s), initial encounter: Secondary | ICD-10-CM | POA: Diagnosis not present

## 2017-04-01 DIAGNOSIS — Z8673 Personal history of transient ischemic attack (TIA), and cerebral infarction without residual deficits: Secondary | ICD-10-CM | POA: Insufficient documentation

## 2017-04-01 DIAGNOSIS — W010XXA Fall on same level from slipping, tripping and stumbling without subsequent striking against object, initial encounter: Secondary | ICD-10-CM | POA: Insufficient documentation

## 2017-04-01 DIAGNOSIS — Y939 Activity, unspecified: Secondary | ICD-10-CM | POA: Insufficient documentation

## 2017-04-01 DIAGNOSIS — S62317A Displaced fracture of base of fifth metacarpal bone. left hand, initial encounter for closed fracture: Secondary | ICD-10-CM | POA: Diagnosis not present

## 2017-04-01 NOTE — ED Triage Notes (Signed)
Patient to ER for c/o hand pain to left hand after tripping over dog 3-4 days ago. Patient has swelling at outer side of left hand.

## 2017-04-01 NOTE — ED Notes (Signed)
See triage note  States he tripped over dog about 4-5 days ago Pain and swelling noted to lateral left hand

## 2017-04-01 NOTE — ED Provider Notes (Signed)
[facility] Emergency Department Provider Note   ____________________________________________   I have reviewed the triage vital signs and the nursing notes.   HISTORY  Chief Complaint Hand Pain    HPI Adam Anderson is a 46 y.o. male presents with lateral left hand pain after falling after tripping over the dog for 5 days ago. Patient noted increasing pain and swelling along the fifth digit and lateral hand. Patient denies any other injury associated with the fall and denies any injury to the right hand in the past. Patient has a history of a remote stroke affecting the right hand and his limited mobility with the right upper extremity. Patient denies any loss of sensation to the left hand since the injury. Patient denies fever, chills, headache, vision changes, chest pain, chest tightness, shortness of breath, abdominal pain, nausea and vomiting.  History reviewed. No pertinent past medical history.  There are no active problems to display for this patient.   History reviewed. No pertinent surgical history.  Prior to Admission medications   Medication Sig Start Date End Date Taking? Authorizing Provider  oxyCODONE-acetaminophen (PERCOCET) 7.5-325 MG per tablet Take 1 tablet by mouth every 6 (six) hours as needed for severe pain. 05/13/15   Joni Reining, PA-C    Allergies Patient has no known allergies.  No family history on file.  Social History Social History  Substance Use Topics  . Smoking status: Current Every Day Smoker  . Smokeless tobacco: Never Used  . Alcohol use Yes    Review of Systems Constitutional:  Negative for fever/chills Eyes: No visual changes. Cardiovascular: Denies chest pain. Respiratory: Denies shortness of breath. Musculoskeletal: Left hand pain, fifth digit Skin: Negative for rash. Neurological: Negative for headaches.  Negative focal weakness or numbness. Negative for loss of consciousness.  ____________________________________________   PHYSICAL EXAM:  VITAL SIGNS: ED Triage Vitals  Enc Vitals Group     BP 04/01/17 1038 133/79     Pulse Rate 04/01/17 1038 61     Resp 04/01/17 1038 18     Temp 04/01/17 1038 98 F (36.7 C)     Temp Source 04/01/17 1038 Oral     SpO2 04/01/17 1038 100 %     Weight 04/01/17 1039 132 lb (59.9 kg)     Height 04/01/17 1039 5\' 6"  (1.676 m)     Head Circumference --      Peak Flow --      Pain Score 04/01/17 1038 6     Pain Loc --      Pain Edu? --      Excl. in GC? --     Constitutional: Alert and oriented. Well appearing and in no acute distress.  Head: Normocephalic and atraumatic. Eyes: Conjunctivae are normal. Cardiovascular: Normal rate, regular rhythm. Normal distal pulses. Respiratory: Normal respiratory effort.  Gastrointestinal: Soft and nontender. Musculoskeletal: Nontender with normal range of motion in all extremities except left fifth metacarpal of the left hand secondary to fracture. Significant swelling noted, movement and sensation intact. Function movement hand otherwise normal. Neurologic: Normal speech and language. No gross focal neurologic deficits are appreciated.  Skin:  Skin is warm, dry and intact. No rash noted. Psychiatric: Mood and affect are normal.  ____________________________________________   LABS (all labs ordered are listed, but only abnormal results are displayed)  Labs Reviewed - No data to display ____________________________________________  EKG None ____________________________________________  RADIOLOGY DG Left hand FINDINGS: There is an angulated fracture through the distal fifth  metacarpal.  IMPRESSION: Angulated fracture through the distal fifth metacarpal. ____________________________________________   PROCEDURES  Procedure(s) performed: SPLINT APPLICATION Date/Time: 12:41 PM Authorized by: Clois Comberraci M Aashrith Eves Consent: Verbal consent obtained. Risks and benefits: risks,  benefits and alternatives were discussed Consent given by: patient Splint applied by: EMT technician Location details: Right hand, fifth metacarpal  Splint type: Ulnar gutter splint  Supplies used: Ortho-Glass and Ace wrap  Post-procedure: The splinted body part was neurovascularly unchanged following the procedure. Patient tolerance: Patient tolerated the procedure well with no immediate complications.  Initial fracture care was provided. Follow up will be greater than 24 hours.    Critical Care performed: no ____________________________________________   INITIAL IMPRESSION / ASSESSMENT AND PLAN / ED COURSE  Pertinent labs & imaging results that were available during my care of the patient were reviewed by me and considered in my medical decision making (see chart for details).  Patient presents with left hand pain secondary to fifth metacarpal fracture he sustained after falling when he tripped over her dog for 5 days ago. Patient physical exam and imaging findings are consistent with left fifth metacarpa fracture, . Ulnar gutter splint placed. Left upper extremity neurovasculature intact following splint application. Patient was advised to follow up with Orthopedics for continued care and was also advised to return to the emergency department for symptoms that change or worsen. Patient / Family informed of clinical course, understand medical decision-making process, and agree with plan.  Patient was advised to follow up with Orthopedics and was also advised to return to the emergency department for symptoms that change or worsen.      ____________________________________________   FINAL CLINICAL IMPRESSION(S) / ED DIAGNOSES  Final diagnoses:  Closed displaced fracture of base of fifth metacarpal bone of right hand, initial encounter       NEW MEDICATIONS STARTED DURING THIS VISIT:  Discharge Medication List as of 04/01/2017 11:55 AM       Note:  This document was  prepared using Dragon voice recognition software and may include unintentional dictation errors.   Clois ComberLittle, Taylen Osorto M, PA-C 04/01/17 1243    Jeanmarie PlantMcShane, James A, MD 04/01/17 1536

## 2017-04-04 DIAGNOSIS — S62309A Unspecified fracture of unspecified metacarpal bone, initial encounter for closed fracture: Secondary | ICD-10-CM | POA: Diagnosis not present

## 2017-05-04 DIAGNOSIS — S62309A Unspecified fracture of unspecified metacarpal bone, initial encounter for closed fracture: Secondary | ICD-10-CM | POA: Diagnosis not present

## 2017-09-09 ENCOUNTER — Emergency Department: Payer: PPO

## 2017-09-09 ENCOUNTER — Encounter: Payer: Self-pay | Admitting: Emergency Medicine

## 2017-09-09 ENCOUNTER — Emergency Department
Admission: EM | Admit: 2017-09-09 | Discharge: 2017-09-10 | Disposition: A | Discharge status: Home / Self Care | Payer: PPO | Attending: Emergency Medicine | Admitting: Emergency Medicine

## 2017-09-09 DIAGNOSIS — H1131 Conjunctival hemorrhage, right eye: Secondary | ICD-10-CM

## 2017-09-09 DIAGNOSIS — Z8673 Personal history of transient ischemic attack (TIA), and cerebral infarction without residual deficits: Secondary | ICD-10-CM | POA: Insufficient documentation

## 2017-09-09 DIAGNOSIS — S0011XA Contusion of right eyelid and periocular area, initial encounter: Secondary | ICD-10-CM | POA: Diagnosis not present

## 2017-09-09 DIAGNOSIS — Y939 Activity, unspecified: Secondary | ICD-10-CM | POA: Insufficient documentation

## 2017-09-09 DIAGNOSIS — Y929 Unspecified place or not applicable: Secondary | ICD-10-CM | POA: Diagnosis not present

## 2017-09-09 DIAGNOSIS — Z79899 Other long term (current) drug therapy: Secondary | ICD-10-CM | POA: Diagnosis not present

## 2017-09-09 DIAGNOSIS — R079 Chest pain, unspecified: Secondary | ICD-10-CM | POA: Diagnosis not present

## 2017-09-09 DIAGNOSIS — F17200 Nicotine dependence, unspecified, uncomplicated: Secondary | ICD-10-CM | POA: Diagnosis not present

## 2017-09-09 DIAGNOSIS — R0789 Other chest pain: Secondary | ICD-10-CM

## 2017-09-09 DIAGNOSIS — T148XXA Other injury of unspecified body region, initial encounter: Secondary | ICD-10-CM

## 2017-09-09 DIAGNOSIS — H05231 Hemorrhage of right orbit: Secondary | ICD-10-CM

## 2017-09-09 DIAGNOSIS — X58XXXA Exposure to other specified factors, initial encounter: Secondary | ICD-10-CM | POA: Diagnosis not present

## 2017-09-09 DIAGNOSIS — Y999 Unspecified external cause status: Secondary | ICD-10-CM | POA: Diagnosis not present

## 2017-09-09 DIAGNOSIS — R51 Headache: Secondary | ICD-10-CM | POA: Diagnosis not present

## 2017-09-09 DIAGNOSIS — S0990XA Unspecified injury of head, initial encounter: Secondary | ICD-10-CM | POA: Diagnosis present

## 2017-09-09 HISTORY — DX: Cerebral infarction, unspecified: I63.9

## 2017-09-09 LAB — CBC
HEMATOCRIT: 46.1 % (ref 40.0–52.0)
HEMOGLOBIN: 15.9 g/dL (ref 13.0–18.0)
MCH: 31.3 pg (ref 26.0–34.0)
MCHC: 34.6 g/dL (ref 32.0–36.0)
MCV: 90.7 fL (ref 80.0–100.0)
PLATELETS: 183 10*3/uL (ref 150–440)
RBC: 5.08 MIL/uL (ref 4.40–5.90)
RDW: 13 % (ref 11.5–14.5)
WBC: 11 10*3/uL — ABNORMAL HIGH (ref 3.8–10.6)

## 2017-09-09 LAB — BASIC METABOLIC PANEL
ANION GAP: 9 (ref 5–15)
BUN: 11 mg/dL (ref 6–20)
CHLORIDE: 102 mmol/L (ref 101–111)
CO2: 26 mmol/L (ref 22–32)
Calcium: 9.2 mg/dL (ref 8.9–10.3)
Creatinine, Ser: 0.73 mg/dL (ref 0.61–1.24)
GFR calc Af Amer: >60 mL/min (ref >60)
GFR calc non Af Amer: >60 mL/min (ref >60)
Glucose, Bld: 122 mg/dL — ABNORMAL HIGH (ref 65–99)
Potassium: 3.5 mmol/L (ref 3.5–5.1)
Sodium: 137 mmol/L (ref 135–145)

## 2017-09-09 LAB — TROPONIN I: Troponin I: <0.03 ng/mL (ref <0.03)

## 2017-09-09 NOTE — ED Notes (Signed)
Patient states that he had a sharp pain to his right side of his head last night that lasted only a few seconds. Patient states that shortly after that he started having some bruising under his right eye. Patient states that he woke up this morning with bruising and swelling to right eye. Patient states that he also developed right side chest pain that radiated to his back. Patient denies any injury.

## 2017-09-09 NOTE — ED Notes (Signed)
Verbal report to Jabil Circuitmichele, RCharity fundraiser

## 2017-09-09 NOTE — ED Notes (Signed)
Patient transported to CT 

## 2017-09-09 NOTE — ED Triage Notes (Signed)
Pt with bruising and redden raised area to right orbit and right temporal region x1 day, no injury recalled , also aching all over with associated chest pain that radiates straight through to his back .  Pt with prior stroke 20 years ago

## 2017-09-09 NOTE — ED Notes (Signed)
Patient transported to X-ray 

## 2017-09-10 MED ORDER — TETRACAINE HCL 0.5 % OP SOLN
OPHTHALMIC | Status: AC
  Filled 2017-09-10: qty 4

## 2017-09-10 MED ORDER — FLUORESCEIN SODIUM 1 MG OP STRP
ORAL_STRIP | OPHTHALMIC | Status: AC
  Filled 2017-09-10: qty 1

## 2017-09-10 MED ORDER — KETOROLAC TROMETHAMINE 30 MG/ML IJ SOLN
30.0000 mg | Freq: Once | INTRAMUSCULAR | Status: AC
  Administered 2017-09-10: 30 mg via INTRAMUSCULAR
  Filled 2017-09-10: qty 1

## 2017-09-10 MED ORDER — TRAMADOL HCL 50 MG PO TABS: 50.0000 mg | ORAL_TABLET | Freq: Four times a day (QID) | ORAL | 0 refills | Status: DC | PRN

## 2017-09-10 NOTE — ED Notes (Signed)
ED Provider at bedside. 

## 2017-09-10 NOTE — ED Provider Notes (Signed)
[facility] Emergency Department Provider Note   ____________________________________________   First MD Initiated Contact with Patient 09/10/17 0001     (approximate)  I have reviewed the triage vital signs and the nursing notes.   HISTORY  Chief Complaint Eye Pain    HPI Adam Anderson is a 46 y.o. male who presents to the ED from home with a chief complaint of bruising to right eye, right chest and rib pain and "sore all over".  Patient admits he drank 5 beers last evening.  Does not recall fall/trauma, assault or injury.  Awoke this afternoon with right black eye, rib pain and soreness all over.  Denies headache, vision changes, neck pain, abdominal pain, hematuria, nausea or vomiting.  Has not taken anything for the pain.  Movement makes his pain worse.   Past Medical History:  Diagnosis Date  . Stroke [facility])     There are no active problems to display for this patient.   History reviewed. No pertinent surgical history.  Prior to Admission medications   Medication Sig Start Date End Date Taking? Authorizing Provider  oxyCODONE-acetaminophen (PERCOCET) 7.5-325 MG per tablet Take 1 tablet by mouth every 6 (six) hours as needed for severe pain. 05/13/15   Joni Reining, PA-C  traMADol (ULTRAM) 50 MG tablet Take 1 tablet (50 mg total) by mouth every 6 (six) hours as needed. 09/10/17   Irean Hong, MD    Allergies Patient has no known allergies.  No family history on file.  Social History Social History  Substance Use Topics  . Smoking status: Current Every Day Smoker  . Smokeless tobacco: Never Used  . Alcohol use Yes    Review of Systems  Constitutional: Positive for generalized soreness.  No fever/chills. Eyes: Positive for black eye on the right.  No visual changes. ENT: No sore throat. Cardiovascular: Positive for right chest and rib pain. Respiratory: Denies shortness of breath. Gastrointestinal: No abdominal pain.  No nausea, no  vomiting.  No diarrhea.  No constipation. Genitourinary: Negative for dysuria. Musculoskeletal: Negative for back pain. Skin: Negative for rash. Neurological: Negative for headaches, focal weakness or numbness.   ____________________________________________   PHYSICAL EXAM:  VITAL SIGNS: ED Triage Vitals  Enc Vitals Group     BP 09/09/17 2038 (!) 146/90     Pulse Rate 09/09/17 2038 84     Resp 09/09/17 2038 18     Temp 09/09/17 2038 98.4 F (36.9 C)     Temp Source 09/09/17 2038 Oral     SpO2 09/09/17 2038 98 %     Weight 09/09/17 2038 123 lb (55.8 kg)     Height 09/09/17 2038 5\' 6"  (1.676 m)     Head Circumference --      Peak Flow --      Pain Score 09/09/17 2037 8     Pain Loc --      Pain Edu? --      Excl. in GC? --     Constitutional: Alert and oriented. Well appearing and in no acute distress. Eyes: Right periorbital hematoma.  Ecchymosis extends to right temple.  Tiny right lateral subconjunctival hematoma.  PERRLA.  EOMI.  No surrounding warmth or erythema to suggest cellulitis.  VA noted.  Right eye stained with fluorescein and examined under Woods lamp.  No corneal abrasion noted.  Patient reports baseline "right lazy eye and I only use my left eye to see". Ears: No hemotympanum. Head: Atraumatic. Nose: No  deformity. Mouth/Throat: No dental malocclusion.   Neck: No stridor.  No cervical spine tenderness to palpation.  Bilateral trapezius muscle spasms. Cardiovascular: Normal rate, regular rhythm. Grossly normal heart sounds.  Good peripheral circulation. Respiratory: Normal respiratory effort.  No retractions. Lungs CTAB.  Mild splinting.  Right chest wall and anterior lower ribs tender to palpation.  No crepitus.  Painful to sit up or move trunk. Gastrointestinal: Soft and nontender. No distention. No abdominal bruits. No CVA tenderness. Genitourinary: Circumcised male, bilaterally distended testicles which are nonswollen and nontender.  No external evidence of  injury. Musculoskeletal: No spinal tenderness to palpation.  No step-offs or deformities noted.  Bilateral paraspinal lumbar muscle spasms.  Abrasions to left hip.  No lower extremity tenderness nor edema.  No joint effusions. Neurologic: Alert and oriented x4.  Normal speech and language. No gross focal neurologic deficits are appreciated. MAEx4. Skin:  Skin is warm, dry and intact. No rash noted. Psychiatric: Mood and affect are normal. Speech and behavior are normal.  ____________________________________________   LABS (all labs ordered are listed, but only abnormal results are displayed)  Labs Reviewed  BASIC METABOLIC PANEL - Abnormal; Notable for the following:       Result Value   Glucose, Bld 122 (*)    All other components within normal limits  CBC - Abnormal; Notable for the following:    WBC 11.0 (*)    All other components within normal limits  TROPONIN I   ____________________________________________  EKG  ED ECG REPORT I, Jaquelinne Glendening J, the attending physician, personally viewed and interpreted this ECG.   Date: 09/10/2017  EKG Time: 2045  Rate: 65  Rhythm: normal EKG, normal sinus rhythm  Axis: Normal  Intervals:right bundle branch block  ST&T Change: Nonspecific  ____________________________________________  RADIOLOGY  Dg Chest 2 View  Result Date: 09/09/2017 CLINICAL DATA:  Bruising and redness in the right orbit in temporal region. Achiness. Chest pain. EXAM: CHEST  2 VIEW COMPARISON:  December 17, 2012 FINDINGS: No pneumothorax. The heart, hila, and mediastinum are normal. Coarsened lung markings identified. No suspicious nodule or masses. Probable nipple shadow on the right. No focal infiltrate. IMPRESSION: Coarsened lung markings may be nonacute. No acute infiltrate. Probable subtle nipple shadows. Electronically Signed   By: Gerome Sam III M.D   On: 09/09/2017 21:22   Ct Head Wo Contrast  Result Date: 09/09/2017 CLINICAL DATA:  Spontaneous right  orbital bruising after sharp temporal pain for 1 day. No known injury. Patient reports prior stroke. EXAM: CT HEAD WITHOUT CONTRAST TECHNIQUE: Contiguous axial images were obtained from the base of the skull through the vertex without intravenous contrast. COMPARISON:  None. FINDINGS: Brain: No intracranial hemorrhage. Left periventricular encephalomalacia and remote lacunar infarcts in the basal ganglia with ex vacuo dilatation of the left lateral ventricle. Asymmetric prominence of the left sulci compared to right. No CT findings of acute ischemia. No subdural collection. Possible small pineal cyst with calcification. Vascular: No hyperdense vessel, the left MCA appears diminutive compared to right, limited assessment on noncontrast exam. Skull: No fracture or focal lesion. Sinuses/Orbits: The orbits where included are normal. Post small right periorbital skin thickening without dominant hematoma. Minimal opacification of right ethmoid air cells, paranasal sinuses are otherwise clear. Mastoid air cells are clear. Other: None. IMPRESSION: 1.  No acute intracranial abnormality. 2. Skin/soft tissue thickening in the right periorbital soft tissues, nonspecific. 3. Sequela of remote left MCA distribution ischemia. Electronically Signed   By: Lujean Rave.D.  On: 09/09/2017 21:45    ____________________________________________   PROCEDURES  Procedure(s) performed: None  Procedures  Critical Care performed: No  ____________________________________________   INITIAL IMPRESSION / ASSESSMENT AND PLAN / ED COURSE  As part of my medical decision making, I reviewed the following data within the electronic MEDICAL RECORD NUMBER Nursing notes reviewed and incorporated, Labs reviewed, Radiograph reviewed and Notes from prior ED visits.   46 year old male who awoke after a night of heavy drinking with right periorbital hematoma, chest and rib pain, abrasions and generalized soreness.  Differential  diagnosis includes but is not limited to intracranial hemorrhage, facial contusion, max of facial fractures, rib fracture, CAD, chest contusion.  Laboratory and imaging studies unremarkable except for soft tissue swelling.  Patient's clinical appearance is very strongly suggestive of traumatic injuries.  Will administer IM Toradol, discharge home with limited prescription for analgesics and patient will follow up with ophthalmology as needed next week.  Strict return precautions given.  Patient verbalizes understanding and agrees with plan of care.      ____________________________________________   FINAL CLINICAL IMPRESSION(S) / ED DIAGNOSES  Final diagnoses:  Subconjunctival hematoma, right  Periorbital hematoma of right eye  Abrasion  Right-sided chest wall pain      NEW MEDICATIONS STARTED DURING THIS VISIT:  New Prescriptions   TRAMADOL (ULTRAM) 50 MG TABLET    Take 1 tablet (50 mg total) by mouth every 6 (six) hours as needed.     Note:  This document was prepared using Dragon voice recognition software and may include unintentional dictation errors.    Irean HongSung, Rishav Rockefeller J, MD 09/10/17 [phone]

## 2017-09-10 NOTE — Discharge Instructions (Signed)
1.  You may take Ultram as needed for pain. 2.  Apply ice to right eye several times daily to reduce swelling. 3.  Return to the ER for worsening symptoms, persistent vomiting, difficulty breathing, lethargy or other concerns.

## 2018-03-18 ENCOUNTER — Emergency Department: Payer: Medicare HMO

## 2018-03-18 ENCOUNTER — Emergency Department
Admission: EM | Admit: 2018-03-18 | Discharge: 2018-03-18 | Disposition: A | Discharge status: Home / Self Care | Payer: Medicare HMO | Attending: Emergency Medicine | Admitting: Emergency Medicine

## 2018-03-18 ENCOUNTER — Encounter: Payer: Self-pay | Admitting: Emergency Medicine

## 2018-03-18 DIAGNOSIS — S61411A Laceration without foreign body of right hand, initial encounter: Secondary | ICD-10-CM

## 2018-03-18 DIAGNOSIS — Z8673 Personal history of transient ischemic attack (TIA), and cerebral infarction without residual deficits: Secondary | ICD-10-CM | POA: Diagnosis not present

## 2018-03-18 DIAGNOSIS — Y929 Unspecified place or not applicable: Secondary | ICD-10-CM | POA: Insufficient documentation

## 2018-03-18 DIAGNOSIS — S61511A Laceration without foreign body of right wrist, initial encounter: Secondary | ICD-10-CM | POA: Diagnosis not present

## 2018-03-18 DIAGNOSIS — W25XXXA Contact with sharp glass, initial encounter: Secondary | ICD-10-CM | POA: Insufficient documentation

## 2018-03-18 DIAGNOSIS — Y939 Activity, unspecified: Secondary | ICD-10-CM | POA: Insufficient documentation

## 2018-03-18 DIAGNOSIS — Y999 Unspecified external cause status: Secondary | ICD-10-CM | POA: Diagnosis not present

## 2018-03-18 DIAGNOSIS — F17200 Nicotine dependence, unspecified, uncomplicated: Secondary | ICD-10-CM | POA: Diagnosis not present

## 2018-03-18 DIAGNOSIS — R69 Illness, unspecified: Secondary | ICD-10-CM | POA: Diagnosis not present

## 2018-03-18 MED ORDER — CEPHALEXIN 500 MG PO CAPS: 500.0000 mg | ORAL_CAPSULE | Freq: Three times a day (TID) | ORAL | 0 refills | Status: AC

## 2018-03-18 MED ORDER — LIDOCAINE HCL (PF) 1 % IJ SOLN
INTRAMUSCULAR | Status: AC
  Administered 2018-03-18: 10 mL
  Filled 2018-03-18: qty 5

## 2018-03-18 MED ORDER — LIDOCAINE HCL 1 % IJ SOLN
10.0000 mL | Freq: Once | INTRAMUSCULAR | Status: AC
  Administered 2018-03-18: 10 mL
  Filled 2018-03-18: qty 10

## 2018-03-18 NOTE — ED Provider Notes (Signed)
[facility] Emergency Department Provider Note  ____________________________________________  Time seen: Approximately 5:11 PM  I have reviewed the triage vital signs and the nursing notes.   HISTORY  Chief Complaint Laceration    HPI Adam Anderson is a 47 y.o. male presents to the emergency department with a 4 cm volar, wrist laceration sustained accidentally with a broken glass bottle.  Patient reports that hemostasis was quickly achieved.  He denies new weakness, radiculopathy or changes in sensation of the upper extremities.  Tetanus status is up-to-date.  Patient is left-handed.   Past Medical History:  Diagnosis Date  . Stroke [facility])     There are no active problems to display for this patient.   History reviewed. No pertinent surgical history.  Prior to Admission medications   Medication Sig Start Date End Date Taking? Authorizing Provider  cephALEXin (KEFLEX) 500 MG capsule Take 1 capsule (500 mg total) by mouth 3 (three) times daily for 10 days. 03/18/18 03/28/18  Orvil Feil, PA-C  oxyCODONE-acetaminophen (PERCOCET) 7.5-325 MG per tablet Take 1 tablet by mouth every 6 (six) hours as needed for severe pain. 05/13/15   Joni Reining, PA-C  traMADol (ULTRAM) 50 MG tablet Take 1 tablet (50 mg total) by mouth every 6 (six) hours as needed. 09/10/17   Irean Hong, MD    Allergies Patient has no known allergies.  No family history on file.  Social History Social History   Tobacco Use  . Smoking status: Current Every Day Smoker  . Smokeless tobacco: Never Used  Substance Use Topics  . Alcohol use: Yes  . Drug use: Not on file     Review of Systems  Constitutional: No fever/chills Eyes: No visual changes. No discharge ENT: No upper respiratory complaints. Cardiovascular: no chest pain. Respiratory: no cough. No SOB. Gastrointestinal: No abdominal pain.  No nausea, no vomiting.  No diarrhea.  No constipation. Musculoskeletal:  Negative for musculoskeletal pain. Skin: Patient has 4 cm right wrist laceration.  Neurological: Negative for headaches, focal weakness or numbness.   ____________________________________________   PHYSICAL EXAM:  VITAL SIGNS: ED Triage Vitals  Enc Vitals Group     BP 03/18/18 1652 113/67     Pulse Rate 03/18/18 1652 68     Resp 03/18/18 1652 18     Temp 03/18/18 1652 98 F (36.7 C)     Temp Source 03/18/18 1652 Oral     SpO2 03/18/18 1652 100 %     Weight 03/18/18 1653 130 lb (59 kg)     Height 03/18/18 1653  (1.727 m)     Head Circumference --      Peak Flow --      Pain Score 03/18/18 1653 1     Pain Loc --      Pain Edu? --      Excl. in GC? --      Constitutional: Alert and oriented. Well appearing and in no acute distress. Eyes: Conjunctivae are normal. PERRL. EOMI. Head: Atraumatic. Cardiovascular: Normal rate, regular rhythm. Normal S1 and S2.  Good peripheral circulation. Respiratory: Normal respiratory effort without tachypnea or retractions. Lungs CTAB. Good air entry to the bases with no decreased or absent breath sounds. Musculoskeletal: Full range of motion to all extremities. No gross deformities appreciated. Neurologic:  Normal speech and language. No gross focal neurologic deficits are appreciated.  Skin: Patient has a 4 cm vertical, mostly linear laceration along the medial aspect of the right wrist, extending  into right forearm.  Palpable radial pulse, right. Psychiatric: Mood and affect are normal. Speech and behavior are normal. Patient exhibits appropriate insight and judgement.   ____________________________________________   LABS (all labs ordered are listed, but only abnormal results are displayed)  Labs Reviewed - No data to display ____________________________________________  EKG   ____________________________________________  RADIOLOGY Geraldo Pitter, personally viewed and evaluated these images (plain radiographs) as part  of my medical decision making, as well as reviewing the written report by the radiologist.    Dg Wrist Complete Right  Result Date: 03/18/2018 CLINICAL DATA:  Laceration on the inner aspect of the right forearm and right wrist due to a fall on a glass bottle today. Initial encounter. EXAM: RIGHT WRIST - COMPLETE 3+ VIEW COMPARISON:  Plain films of the right hand 02/25/2008. FINDINGS: There is no evidence of fracture or dislocation. There is no evidence of arthropathy or other focal bone abnormality. Soft tissues are unremarkable. IMPRESSION: Negative exam. Electronically Signed   By: Drusilla Kanner M.D.   On: 03/18/2018 17:28    ____________________________________________    PROCEDURES  Procedure(s) performed:    Procedures  LACERATION REPAIR Performed by: Orvil Feil Authorized by: Orvil Feil Consent: Verbal consent obtained. Risks and benefits: risks, benefits and alternatives were discussed Consent given by: patient Patient identity confirmed: provided demographic data Prepped and Draped in normal sterile fashion Wound explored  Laceration Location: Right wrist   Laceration Length: 4 cm  No Foreign Bodies seen or palpated  Anesthesia: local infiltration  Local anesthetic: lidocaine 1% without epinephrine  Anesthetic total: 5 ml  Irrigation method: syringe Amount of cleaning: standard  Skin closure: 4-0 Ethilon   Number of sutures: 9  Technique: Simple Interrupted   Patient tolerance: Patient tolerated the procedure well with no immediate complications.   Medications  lidocaine (XYLOCAINE) 1 % (with pres) injection 10 mL (10 mLs Infiltration Given 03/18/18 1721)     ____________________________________________   INITIAL IMPRESSION / ASSESSMENT AND PLAN / ED COURSE  Pertinent labs & imaging results that were available during my care of the patient were reviewed by me and considered in my medical decision making (see chart for  details).  Review of the [place] CSRS was performed in accordance of the [facility] prior to dispensing any controlled drugs.     Assessment and Plan:  Right wrist laceration Patient presents to the emergency department with a 4 cm laceration sustained with a glass bottle.  No acute foreign bodies were identified on x-ray examination of the right wrist.  Patient underwent laceration repair without complication.  He was advised to have sutures removed in 9 days.  He was discharged with Keflex.  Vital signs are reassuring prior to discharge.    ____________________________________________  FINAL CLINICAL IMPRESSION(S) / ED DIAGNOSES  Final diagnoses:  Laceration of right hand without foreign body, initial encounter      NEW MEDICATIONS STARTED DURING THIS VISIT:  ED Discharge Orders        Ordered    cephALEXin (KEFLEX) 500 MG capsule  3 times daily     03/18/18 1744          This chart was dictated using voice recognition software/Dragon. Despite best efforts to proofread, errors can occur which can change the meaning. Any change was purely unintentional.    Orvil Feil, PA-C 03/18/18 1757    Myrna Blazer, MD 03/18/18 [phone]

## 2018-03-18 NOTE — ED Triage Notes (Signed)
PT arrived with complaints of laceration to his right inner arm. Pt reports it was cut on a glass bottle. Bleeding contained at this time.

## 2018-06-02 ENCOUNTER — Other Ambulatory Visit: Payer: Self-pay

## 2018-06-02 ENCOUNTER — Emergency Department
Admission: EM | Admit: 2018-06-02 | Discharge: 2018-06-04 | Disposition: A | Discharge status: Home / Self Care | Payer: Medicare HMO | Attending: Emergency Medicine | Admitting: Emergency Medicine

## 2018-06-02 DIAGNOSIS — Z8673 Personal history of transient ischemic attack (TIA), and cerebral infarction without residual deficits: Secondary | ICD-10-CM | POA: Insufficient documentation

## 2018-06-02 DIAGNOSIS — F17200 Nicotine dependence, unspecified, uncomplicated: Secondary | ICD-10-CM | POA: Insufficient documentation

## 2018-06-02 DIAGNOSIS — Z046 Encounter for general psychiatric examination, requested by authority: Secondary | ICD-10-CM | POA: Diagnosis present

## 2018-06-02 DIAGNOSIS — F32A Depression, unspecified: Secondary | ICD-10-CM

## 2018-06-02 DIAGNOSIS — F1010 Alcohol abuse, uncomplicated: Secondary | ICD-10-CM

## 2018-06-02 DIAGNOSIS — F1994 Other psychoactive substance use, unspecified with psychoactive substance-induced mood disorder: Secondary | ICD-10-CM

## 2018-06-02 DIAGNOSIS — R45851 Suicidal ideations: Secondary | ICD-10-CM | POA: Diagnosis not present

## 2018-06-02 DIAGNOSIS — F329 Major depressive disorder, single episode, unspecified: Secondary | ICD-10-CM | POA: Insufficient documentation

## 2018-06-02 DIAGNOSIS — R69 Illness, unspecified: Secondary | ICD-10-CM | POA: Diagnosis not present

## 2018-06-02 DIAGNOSIS — F451 Undifferentiated somatoform disorder: Secondary | ICD-10-CM

## 2018-06-02 LAB — ETHANOL: Alcohol, Ethyl (B): 104 mg/dL — ABNORMAL HIGH (ref <10)

## 2018-06-02 LAB — COMPREHENSIVE METABOLIC PANEL
ALK PHOS: 64 U/L (ref 38–126)
ALT: 24 U/L (ref 0–44)
AST: 27 U/L (ref 15–41)
Albumin: 4.4 g/dL (ref 3.5–5.0)
Anion gap: 10 (ref 5–15)
BUN: 8 mg/dL (ref 6–20)
CALCIUM: 8.9 mg/dL (ref 8.9–10.3)
CHLORIDE: 105 mmol/L (ref 98–111)
CO2: 24 mmol/L (ref 22–32)
CREATININE: 0.63 mg/dL (ref 0.61–1.24)
GFR calc Af Amer: >60 mL/min (ref >60)
GFR calc non Af Amer: >60 mL/min (ref >60)
Glucose, Bld: 101 mg/dL — ABNORMAL HIGH (ref 70–99)
Potassium: 3.5 mmol/L (ref 3.5–5.1)
SODIUM: 139 mmol/L (ref 135–145)
Total Bilirubin: 0.4 mg/dL (ref 0.3–1.2)
Total Protein: 7.3 g/dL (ref 6.5–8.1)

## 2018-06-02 LAB — CBC
HEMATOCRIT: 47.7 % (ref 40.0–52.0)
HEMOGLOBIN: 16.7 g/dL (ref 13.0–18.0)
MCH: 33.3 pg (ref 26.0–34.0)
MCHC: 35.1 g/dL (ref 32.0–36.0)
MCV: 94.9 fL (ref 80.0–100.0)
Platelets: 169 10*3/uL (ref 150–440)
RBC: 5.02 MIL/uL (ref 4.40–5.90)
RDW: 12.9 % (ref 11.5–14.5)
WBC: 8.5 10*3/uL (ref 3.8–10.6)

## 2018-06-02 LAB — ACETAMINOPHEN LEVEL: Acetaminophen (Tylenol), Serum: <10 ug/mL — ABNORMAL LOW (ref 10–30)

## 2018-06-02 LAB — SALICYLATE LEVEL

## 2018-06-02 NOTE — ED Notes (Signed)
Scott, ed tech in to dress out pt. Scott notified pt is a one to one watch.

## 2018-06-02 NOTE — ED Notes (Signed)
Pt dressed out. Pt belongings bag: belt, pants, pair of socks, pair of shoes, watch, long sleeved shirt, hat.

## 2018-06-02 NOTE — ED Triage Notes (Signed)
Pt states he is depressed and suicidal. Pt brought by [place] sherriff voluntarily. Pt is tearful in triage.

## 2018-06-02 NOTE — ED Provider Notes (Signed)
[facility] Emergency Department Provider Note  ____________________________________________  Time seen: Approximately 11:32 PM  I have reviewed the triage vital signs and the nursing notes.   HISTORY  Chief Complaint Suicidal    HPI Adam STUDLEY is a 47 y.o. male with a history of stroke who complains of feeling depressed.  He is felt this way for the past 20 years since his stroke.  He has chronic pain that he attributes to the stroke.  He has suicidal thoughts every day but reports that he will not act on it and is not worried about his own safety.  He also notes that the suicidal thoughts have been present for the past 20 years.  Denies any acute stressors or acute changes in his symptomatology.  He came to the emergency department today because he just feels fed up.  Reports that he is eating, and sleeping per his usual habits.  No insomnia.  No anorexia.  Reports that he drinks about 5 caffeinated monster drinks a day, but is trying to cut back.   Denies palpitations or headaches or vision changes.  No history of suicide attempt.   Past Medical History:  Diagnosis Date  . Stroke [facility])      There are no active problems to display for this patient.    No past surgical history on file.   Prior to Admission medications   Medication Sig Start Date End Date Taking? Authorizing Provider  oxyCODONE-acetaminophen (PERCOCET) 7.5-325 MG per tablet Take 1 tablet by mouth every 6 (six) hours as needed for severe pain. 05/13/15   Joni Reining, PA-C  traMADol (ULTRAM) 50 MG tablet Take 1 tablet (50 mg total) by mouth every 6 (six) hours as needed. 09/10/17   Irean Hong, MD     Allergies Patient has no known allergies.   No family history on file.  Social History Social History   Tobacco Use  . Smoking status: Current Every Day Smoker  . Smokeless tobacco: Never Used  Substance Use Topics  . Alcohol use: Yes  . Drug use: Not on file     Review of Systems  Constitutional:   No fever or chills.  Cardiovascular:   No chest pain or syncope. Respiratory:   No dyspnea or cough. Gastrointestinal:   Negative for abdominal pain, vomiting and diarrhea.  Musculoskeletal:   Negative for focal pain or swelling All other systems reviewed and are negative except as documented above in ROS and HPI.  ____________________________________________   PHYSICAL EXAM:  VITAL SIGNS: ED Triage Vitals  Enc Vitals Group     BP 06/02/18 1938 (!) 161/93     Pulse Rate 06/02/18 1938 93     Resp 06/02/18 1938 20     Temp 06/02/18 1938 98.1 F (36.7 C)     Temp Source 06/02/18 1938 Oral     SpO2 06/02/18 1938 98 %     Weight 06/02/18 1939 120 lb (54.4 kg)     Height 06/02/18 1939 5\' 6"  (1.676 m)     Head Circumference --      Peak Flow --      Pain Score 06/02/18 1939 7     Pain Loc --      Pain Edu? --      Excl. in GC? --     Vital signs reviewed, nursing assessments reviewed.   Constitutional:   Alert and oriented. Non-toxic appearance.  Tearful Eyes:   Conjunctivae are normal. EOMI. ENT  Head:   Normocephalic and atraumatic.      Nose:   No congestion/rhinnorhea.       Mouth/Throat:   MMM, no pharyngeal erythema. No peritonsillar mass.  Alcohol on breath      Neck:   No meningismus. Full ROM. Hematological/Lymphatic/Immunilogical:   No cervical lymphadenopathy. Cardiovascular:   RRR. Symmetric bilateral radial and DP pulses.  No murmurs. Cap refill less than 2 seconds. Respiratory:   Normal respiratory effort without tachypnea/retractions. Breath sounds are clear and equal bilaterally. No wheezes/rales/rhonchi. Gastrointestinal:   Soft and nontender. Non distended. There is no CVA tenderness.  No rebound, rigidity, or guarding.  Musculoskeletal:   Normal range of motion in all extremities. No joint effusions.  No lower extremity tenderness.  No edema.  Floppiness of bilateral wrists Neurologic:   Normal speech and  language.  Weakness of bilateral wrists and hands, chronic per patient.  Able to move upper extremities proximally just fine. No acute focal neurologic deficits are appreciated.  Skin:    Skin is warm, dry and intact. No rash noted.  No petechiae, purpura, or bullae.  No wounds  ____________________________________________    LABS (pertinent positives/negatives) (all labs ordered are listed, but only abnormal results are displayed) Labs Reviewed  COMPREHENSIVE METABOLIC PANEL - Abnormal; Notable for the following components:      Result Value   Glucose, Bld 101 (*)    All other components within normal limits  ETHANOL - Abnormal; Notable for the following components:   Alcohol, Ethyl (B) 104 (*)    All other components within normal limits  ACETAMINOPHEN LEVEL - Abnormal; Notable for the following components:   Acetaminophen (Tylenol), Serum <10 (*)    All other components within normal limits  SALICYLATE LEVEL  CBC  URINE DRUG SCREEN, QUALITATIVE ([facility] ONLY)   ____________________________________________   EKG    ____________________________________________    RADIOLOGY  No results found.  ____________________________________________   PROCEDURES Procedures  ____________________________________________    CLINICAL IMPRESSION / ASSESSMENT AND PLAN / ED COURSE  Pertinent labs & imaging results that were available during my care of the patient were reviewed by me and considered in my medical decision making (see chart for details).    Patient presents with depressed mood, very much a chronic stable issue according to the patient's own account.  Do not find any high risk features for imminent danger to himself or others.  I will obtain a psychiatry consult for consideration of medication management in advance of follow-up.  Patient is not committable, medically stable.      ____________________________________________   FINAL CLINICAL IMPRESSION(S) / ED  DIAGNOSES    Final diagnoses:  Suicidal thoughts  Depression, unspecified depression type     ED Discharge Orders    None      Portions of this note were generated with dragon dictation software. Dictation errors may occur despite best attempts at proofreading.    Sharman CheekStafford, Tye Juarez, MD 06/02/18 2337

## 2018-06-02 NOTE — ED Notes (Signed)
Pt. States he is here for suicidal idealization.  Pt. States he had a stroke when he was in his twenties.  Pt. States "my doctor gave me medication that made me worse".  Pt. Became tearful and stated he has had suicidal thoughts for many years, pt. Denies anything recently that has made things worse.  Pt. Stated he does not see a therapist nor is he taking any medication at this time.  Pt. Has ETOH on board.  Pt. Denies A/V/H.

## 2018-06-03 LAB — URINE DRUG SCREEN, QUALITATIVE (ARMC ONLY)
Amphetamines, Ur Screen: NOT DETECTED
BARBITURATES, UR SCREEN: NOT DETECTED
Benzodiazepine, Ur Scrn: NOT DETECTED
CANNABINOID 50 NG, UR ~~LOC~~: POSITIVE — AB
COCAINE METABOLITE, UR ~~LOC~~: NOT DETECTED
MDMA (ECSTASY) UR SCREEN: NOT DETECTED
Methadone Scn, Ur: NOT DETECTED
Opiate, Ur Screen: NOT DETECTED
PHENCYCLIDINE (PCP) UR S: NOT DETECTED
TRICYCLIC, UR SCREEN: NOT DETECTED

## 2018-06-03 NOTE — ED Notes (Signed)
Pt is taking a shower at this time. Rules explained to pt and pt given shower supplies and clean clothing.

## 2018-06-03 NOTE — ED Notes (Signed)
Report to include Situation, Background, Assessment, and Recommendations received from Dorothy RN. Patient alert and oriented, warm and dry, in no acute distress. Patient denies SI, HI, AVH and pain. Patient made aware of Q15 minute rounds and security cameras for their safety. Patient instructed to come to me with needs or concerns.  

## 2018-06-03 NOTE — ED Notes (Signed)
Hourly rounding reveals patient in day room. No complaints, stable, in no acute distress. Q15 minute rounds and monitoring via Security Cameras to continue. 

## 2018-06-03 NOTE — ED Notes (Signed)
Hourly rounding reveals patient sleeping in room. No complaints, stable, in no acute distress. Q15 minute rounds and monitoring via Security Cameras to continue. 

## 2018-06-03 NOTE — ED Notes (Signed)
[facility] in patient room.

## 2018-06-03 NOTE — ED Notes (Addendum)
Pt states he came in because he had a stroke 20 years ago and he just doesn't feel like he needs to be around anymore. Denies SI at time of assessment and was able to contract for safety. Denies HI/AVH. Pt guarded during assessment.

## 2018-06-03 NOTE — ED Notes (Addendum)
Pt given night snack and drink. Pt in room watching tv and resting at this time. Will continue to monitor.

## 2018-06-03 NOTE — ED Notes (Signed)
Pt given meal tray.

## 2018-06-03 NOTE — ED Notes (Signed)
Pt ate breakfast and informed we need urine.

## 2018-06-03 NOTE — ED Notes (Signed)
Pt has returned to his room and all toiletries were returned to tech and wet linens removed from bathroom. Pt is resting at this time. Trash was removed from pt room.

## 2018-06-03 NOTE — BHH Counselor (Signed)
Pt's information has been faxed to the following facilities: [facility]    [place]   [facility]   [facility]   [facility]   [facility]   [facility]   [facility]   [facility]   [facility]

## 2018-06-03 NOTE — ED Notes (Signed)
Pt sleeping. Breakfast tray placed next to pt. 

## 2018-06-03 NOTE — ED Notes (Signed)
Pt given warm blanket.

## 2018-06-03 NOTE — ED Notes (Signed)
Hourly rounding reveals patient sleeping in room. No complaints, stable, in no acute distress. Q15 minute rounds and monitoring via Security to continue. 

## 2018-06-03 NOTE — ED Notes (Signed)
voluntary, telepsych says inpatient

## 2018-06-03 NOTE — ED Notes (Signed)
Hourly rounding reveals patient in room. No complaints, stable, in no acute distress. Q15 minute rounds and monitoring via Security Cameras to continue. 

## 2018-06-03 NOTE — BH Assessment (Signed)
Assessment Note  Adam Anderson is an 47 y.o. male voluntary pt brought into ED via sheriff department. Pt reports that he and father had verbal conflict which resulted in pt stating, "I don't care what happens." Pt reports that his father grew concerned and proceeded to call law enforcement. Pt admits to having frequent thoughts of SI and states that he has had them for over 20 years. Pt reports no plan or prior attempts, but does feel his SI increased 5 years ago after he suffered a stroke. Pt reports, "For the last 20 years, I wake up thinking about committing suicide." Pt admits to drinking to cope with depression. Pt admits to drinking at least two 40 oz beers daily.   Pt currently has DWI and speeding charge with an upcoming court date in August. Pt denies HI, AH, and VH. Pt oriented x4 at time of assessment, also calm and cooperative.  Diagnosis: Major depressive disorder, Alcohol use disorder  Past Medical History:  Past Medical History:  Diagnosis Date  . Stroke [facility]     No past surgical history on file.  Family History: No family history on file.  Social History:  reports that he has been smoking.  He has never used smokeless tobacco. He reports that he drinks alcohol. He reports that . Drug: Marijuana.  Additional Social History:  Alcohol / Drug Use Pain Medications: see PTA Prescriptions: see PTA Over the Counter: see PTA History of alcohol / drug use?: Yes Longest period of sobriety (when/how long): 1 year Negative Consequences of Use: Personal relationships, Work / Programmer, multimedia, Surveyor, quantity Substance #1 Name of Substance 1: alcohol 1 - Age of First Use: 17 1 - Amount (size/oz): 2 40 oz of beer 1 - Frequency: daily 1 - Duration: varies  1 - Last Use / Amount: yesterday  CIWA: CIWA-Ar BP: (!) 161/93 Pulse Rate: 93 COWS:    Allergies: No Known Allergies  Home Medications:  (Not in a hospital admission)  OB/GYN Status:  No LMP for male patient.  General Assessment  Data Location of Assessment: [facility] ED TTS Assessment: In system Is this a Tele or Face-to-Face Assessment?: Face-to-Face Is this an Initial Assessment or a Re-assessment for this encounter?: Initial Assessment Marital status: Single Is patient pregnant?: No Pregnancy Status: No Living Arrangements: Alone Can pt return to current living arrangement?: Yes Admission Status: Voluntary Is patient capable of signing voluntary admission?: Yes Referral Source: Self/Family/Friend Insurance type: [employer] advantage  Medical Screening Exam [facility] Walk-in ONLY) Medical Exam completed: No Reason for MSE not completed: Other:  Crisis Care Plan Living Arrangements: Alone Legal Guardian: Other:(self) Name of Psychiatrist: None  Name of Therapist: None  Education Status Is patient currently in school?: No Is the patient employed, unemployed or receiving disability?: Unemployed  Risk to self with the past 6 months Suicidal Ideation: Yes-Currently Present Has patient been a risk to self within the past 6 months prior to admission? : Yes Suicidal Intent: No Has patient had any suicidal intent within the past 6 months prior to admission? : No Is patient at risk for suicide?: Yes Suicidal Plan?: No Has patient had any suicidal plan within the past 6 months prior to admission? : No Access to Means: No What has been your use of drugs/alcohol within the last 12 months?: Alcohol use daily Previous Attempts/Gestures: No How many times?: 0 Other Self Harm Risks: alcoholism Triggers for Past Attempts: None known Intentional Self Injurious Behavior: None Family Suicide History: No Recent stressful life event(s):  Conflict (Comment), Financial Problems, Recent negative physical changes Persecutory voices/beliefs?: No Depression: Yes Depression Symptoms: Insomnia, Isolating, Fatigue, Guilt, Loss of interest in usual pleasures, Feeling worthless/self pity, Feeling angry/irritable Substance abuse  history and/or treatment for substance abuse?: Yes Suicide prevention information given to non-admitted patients: Not applicable  Risk to Others within the past 6 months Homicidal Ideation: No Does patient have any lifetime risk of violence toward others beyond the six months prior to admission? : No Thoughts of Harm to Others: No Current Homicidal Intent: No Current Homicidal Plan: No Access to Homicidal Means: No Identified Victim: None identified History of harm to others?: No Assessment of Violence: None Noted Violent Behavior Description: None noted Does patient have access to weapons?: No Criminal Charges Pending?: Yes Describe Pending Criminal Charges: DWI, speeding Does patient have a court date: Yes Court Date: 06/22/18 Is patient on probation?: Unknown  Psychosis Hallucinations: None noted Delusions: None noted  Mental Status Report Appearance/Hygiene: Unremarkable Eye Contact: Good Motor Activity: Freedom of movement Speech: Logical/coherent Level of Consciousness: Alert Mood: Depressed, Sad Affect: Depressed Anxiety Level: None Thought Processes: Coherent, Relevant Judgement: Impaired Orientation: Appropriate for developmental age Obsessive Compulsive Thoughts/Behaviors: None  Cognitive Functioning Concentration: Good Memory: Remote Intact, Recent Impaired Is patient IDD: No Is patient DD?: No I IQ score available?: No Insight: Fair Impulse Control: Poor Appetite: Good Have you had any weight changes? : No Change Sleep: Decreased Total Hours of Sleep: 2 Vegetative Symptoms: None  ADLScreening [facility] Assessment Services) Patient's cognitive ability adequate to safely complete daily activities?: Yes Patient able to express need for assistance with ADLs?: Yes Independently performs ADLs?: Yes (appropriate for developmental age)  Prior Inpatient Therapy Prior Inpatient Therapy: No  Prior Outpatient Therapy Prior Outpatient Therapy: No Does patient  have an ACCT team?: No Does patient have Intensive In-House Services?  : No Does patient have [facility] services? : No Does patient have P4CC services?: No  ADL Screening (condition at time of admission) Patient's cognitive ability adequate to safely complete daily activities?: Yes Is the patient deaf or have difficulty hearing?: No Does the patient have difficulty seeing, even when wearing glasses/contacts?: No Does the patient have difficulty concentrating, remembering, or making decisions?: No Patient able to express need for assistance with ADLs?: Yes Does the patient have difficulty dressing or bathing?: No Independently performs ADLs?: Yes (appropriate for developmental age) Does the patient have difficulty walking or climbing stairs?: No Weakness of Legs: None Weakness of Arms/Hands: Right  Home Assistive Devices/Equipment Home Assistive Devices/Equipment: None  Therapy Consults (therapy consults require a physician order) PT Evaluation Needed: No OT Evalulation Needed: No SLP Evaluation Needed: No Abuse/Neglect Assessment (Assessment to be complete while patient is alone) Abuse/Neglect Assessment Can Be Completed: Yes Physical Abuse: Denies Verbal Abuse: Denies Sexual Abuse: Denies Exploitation of patient/patient's resources: Denies Self-Neglect: Denies Values / Beliefs Cultural Requests During Hospitalization: None Spiritual Requests During Hospitalization: None Consults Spiritual Care Consult Needed: No Social Work Consult Needed: No      Additional Information 1:1 In Past 12 Months?: No CIRT Risk: No Elopement Risk: No Does patient have medical clearance?: (Unknown)     Disposition:  Disposition Initial Assessment Completed for this Encounter: Yes(pending disposition) Patient refused recommended treatment: No Mode of transportation if patient is discharged?: Car Patient referred to: Other (Comment)(pending disposition)  On Site Evaluation by:    Reviewed with Physician:    Aubery LappingJerrica  Treyvone Chelf, MS, [facility] 06/03/2018 5:43 AM

## 2018-06-04 DIAGNOSIS — F1010 Alcohol abuse, uncomplicated: Secondary | ICD-10-CM

## 2018-06-04 DIAGNOSIS — F1994 Other psychoactive substance use, unspecified with psychoactive substance-induced mood disorder: Secondary | ICD-10-CM | POA: Diagnosis not present

## 2018-06-04 DIAGNOSIS — R69 Illness, unspecified: Secondary | ICD-10-CM | POA: Diagnosis not present

## 2018-06-04 DIAGNOSIS — F451 Undifferentiated somatoform disorder: Secondary | ICD-10-CM

## 2018-06-04 NOTE — ED Notes (Signed)
Hourly rounding reveals patient sleeping in room. No complaints, stable, in no acute distress. Q15 minute rounds and monitoring via Security Cameras to continue. 

## 2018-06-04 NOTE — ED Notes (Signed)
Pt given all his personal belongings. Pt signed paper d/c form. Escorted to lobby in NAD, ambulatory.

## 2018-06-04 NOTE — Consult Note (Signed)
[place] Psychiatry Consult   Reason for Consult: Consult for this 47 year old man who has been in the emergency room over most of the weekend because of initial presentation with suicidal ideation Referring Physician: Clearnce Hasten Patient Identification: Adam Anderson MRN:  [id number] Principal Diagnosis: Substance induced mood disorder ([place]) Diagnosis:   Patient Active Problem List   Diagnosis Date Noted  . Alcohol abuse [F10.10] 06/04/2018  . Substance induced mood disorder ([place]) [F19.94] 06/04/2018  . Somatic symptom disorder [F45.1] 06/04/2018    Total Time spent with patient: 1 hour  Subjective:   Adam Anderson is a 47 y.o. male patient admitted with "I do not know why I am here".  HPI: Patient interviewed chart reviewed.  This patient came to the emergency room initially a couple days ago.  He was intoxicated at the time and is documented as having made some suicidal thoughts or statements.  Patient has been calm and without any major behavior problems over the weekend.  Efforts were made at referring him to inpatient units.  On reassessment today the patient says he really does not even remember coming into the emergency room.  He is very focused on the fact that he had a stroke many years ago and how he is chronically unhappy because of his physical limitations.  He admits that he uses alcohol fairly regularly.  Denies any seizures or DTs.  Minimizes other drug use although he admits he has occasionally used marijuana.  Patient is now absolutely denying suicidal ideation.  Denies any wish to harm himself or thoughts about dying.  Says his mood stays down and depressed pretty chronically.  Not getting any active treatment for it.  Social history: Patient lives semi-independently.  Not working.  Medical history: Distant history of a stroke with chronic weakness on the right side  Substance abuse history: Chronic alcohol abuse no history of DTs or seizures.  No past treatment that  I can identify  Past Psychiatric History: Does not appear to have ever had any psychiatric treatment in the past.  No history of psychiatric hospitalization or suicide attempts  Risk to Self: Suicidal Ideation: Yes-Currently Present Suicidal Intent: No Is patient at risk for suicide?: Yes Suicidal Plan?: No Access to Means: No What has been your use of drugs/alcohol within the last 12 months?: Alcohol use daily How many times?: 0 Other Self Harm Risks: alcoholism Triggers for Past Attempts: None known Intentional Self Injurious Behavior: None Risk to Others: Homicidal Ideation: No Thoughts of Harm to Others: No Current Homicidal Intent: No Current Homicidal Plan: No Access to Homicidal Means: No Identified Victim: None identified History of harm to others?: No Assessment of Violence: None Noted Violent Behavior Description: None noted Does patient have access to weapons?: No Criminal Charges Pending?: Yes Describe Pending Criminal Charges: DWI, speeding Does patient have a court date: Yes Court Date: 06/22/18 Prior Inpatient Therapy: Prior Inpatient Therapy: No Prior Outpatient Therapy: Prior Outpatient Therapy: No Does patient have an ACCT team?: No Does patient have Intensive In-House Services?  : No Does patient have [facility] services? : No Does patient have P4CC services?: No  Past Medical History:  Past Medical History:  Diagnosis Date  . Stroke [facility])    No past surgical history on file. Family History: No family history on file. Family Psychiatric  History: Does not know of any Social History:  Social History   Substance and Sexual Activity  Alcohol Use Yes     Social History   Substance  and Sexual Activity  Drug Use Not on file    Social History   Socioeconomic History  . Marital status: Single    Spouse name: Not on file  . Number of children: Not on file  . Years of education: Not on file  . Highest education level: Not on file  Occupational  History  . Not on file  Social Needs  . Financial resource strain: Not on file  . Food insecurity:    Worry: Not on file    Inability: Not on file  . Transportation needs:    Medical: Not on file    Non-medical: Not on file  Tobacco Use  . Smoking status: Current Every Day Smoker  . Smokeless tobacco: Never Used  Substance and Sexual Activity  . Alcohol use: Yes  . Drug use: Not on file  . Sexual activity: Not on file  Lifestyle  . Physical activity:    Days per week: Not on file    Minutes per session: Not on file  . Stress: Not on file  Relationships  . Social connections:    Talks on phone: Not on file    Gets together: Not on file    Attends religious service: Not on file    Active member of club or organization: Not on file    Attends meetings of clubs or organizations: Not on file    Relationship status: Not on file  Other Topics Concern  . Not on file  Social History Narrative  . Not on file   Additional Social History:    Allergies:  No Known Allergies  Labs:  Results for orders placed or performed during the hospital encounter of 06/02/18 (from the past 48 hour(s))  Comprehensive metabolic panel     Status: Abnormal   Collection Time: 06/02/18  7:45 PM  Result Value Ref Range   Sodium 139 135 - 145 mmol/L   Potassium 3.5 3.5 - 5.1 mmol/L   Chloride 105 98 - 111 mmol/L   CO2 24 22 - 32 mmol/L   Glucose, Bld 101 (H) 70 - 99 mg/dL   BUN 8 6 - 20 mg/dL   Creatinine, Ser 0.63 0.61 - 1.24 mg/dL   Calcium 8.9 8.9 - 10.3 mg/dL   Total Protein 7.3 6.5 - 8.1 g/dL   Albumin 4.4 3.5 - 5.0 g/dL   AST 27 15 - 41 U/L   ALT 24 0 - 44 U/L   Alkaline Phosphatase 64 38 - 126 U/L   Total Bilirubin 0.4 0.3 - 1.2 mg/dL   GFR calc non Af Amer >60 >60 mL/min   GFR calc Af Amer >60 >60 mL/min    Comment: (NOTE) The eGFR has been calculated using the CKD EPI equation. This calculation has not been validated in all clinical situations. eGFR's persistently <60 mL/min  signify possible Chronic Kidney Disease.    Anion gap 10 5 - 15    Comment: Performed at [facility], [address] [place], [place] [address]  Ethanol     Status: Abnormal   Collection Time: 06/02/18  7:45 PM  Result Value Ref Range   Alcohol, Ethyl (B) 104 (H) <10 mg/dL    Comment: (NOTE) Lowest detectable limit for serum alcohol is 10 mg/dL. For medical purposes only. Performed at [facility], [address] [place], [place] [address]   Salicylate level     Status: None   Collection Time: 06/02/18  7:45 PM  Result Value Ref Range  Salicylate Lvl <3.7 2.8 - 30.0 mg/dL    Comment: Performed at [facility], [address] [place], [place] [address]  Acetaminophen level     Status: Abnormal   Collection Time: 06/02/18  7:45 PM  Result Value Ref Range   Acetaminophen (Tylenol), Serum <10 (L) 10 - 30 ug/mL    Comment: (NOTE) Therapeutic concentrations vary significantly. A range of 10-30 ug/mL  may be an effective concentration for many patients. However, some  are best treated at concentrations outside of this range. Acetaminophen concentrations >150 ug/mL at 4 hours after ingestion  and >50 ug/mL at 12 hours after ingestion are often associated with  toxic reactions. Performed at [facility], [address] [place], [place] [address]   cbc     Status: None   Collection Time: 06/02/18  7:45 PM  Result Value Ref Range   WBC 8.5 3.8 - 10.6 K/uL   RBC 5.02 4.40 - 5.90 MIL/uL   Hemoglobin 16.7 13.0 - 18.0 g/dL   HCT 47.7 40.0 - 52.0 %   MCV 94.9 80.0 - 100.0 fL   MCH 33.3 26.0 - 34.0 pg   MCHC 35.1 32.0 - 36.0 g/dL   RDW 12.9 11.5 - 14.5 %   Platelets 169 150 - 440 K/uL    Comment: Performed at [facility], [address], [place], [place] [address]  Urine Drug Screen, Qualitative     Status: Abnormal   Collection Time: 06/03/18 10:20 AM  Result Value Ref Range   Tricyclic, Ur Screen NONE DETECTED NONE DETECTED    Amphetamines, Ur Screen NONE DETECTED NONE DETECTED   MDMA (Ecstasy)Ur Screen NONE DETECTED NONE DETECTED   Cocaine Metabolite,Ur [place] NONE DETECTED NONE DETECTED   Opiate, Ur Screen NONE DETECTED NONE DETECTED   Phencyclidine (PCP) Ur S NONE DETECTED NONE DETECTED   Cannabinoid 50 Ng, Ur [place] POSITIVE (A) NONE DETECTED   Barbiturates, Ur Screen NONE DETECTED NONE DETECTED   Benzodiazepine, Ur Scrn NONE DETECTED NONE DETECTED   Methadone Scn, Ur NONE DETECTED NONE DETECTED    Comment: (NOTE) Tricyclics + metabolites, urine    Cutoff 1000 ng/mL Amphetamines + metabolites, urine  Cutoff 1000 ng/mL MDMA (Ecstasy), urine              Cutoff 500 ng/mL Cocaine Metabolite, urine          Cutoff 300 ng/mL Opiate + metabolites, urine        Cutoff 300 ng/mL Phencyclidine (PCP), urine         Cutoff 25 ng/mL Cannabinoid, urine                 Cutoff 50 ng/mL Barbiturates + metabolites, urine  Cutoff 200 ng/mL Benzodiazepine, urine              Cutoff 200 ng/mL Methadone, urine                   Cutoff 300 ng/mL The urine drug screen provides only a preliminary, unconfirmed analytical test result and should not be used for non-medical purposes. Clinical consideration and professional judgment should be applied to any positive drug screen result due to possible interfering substances. A more specific alternate chemical method must be used in order to obtain a confirmed analytical result. Gas chromatography / mass spectrometry (GC/MS) is the preferred confirmat ory method. Performed at [facility], [address] [place], [place] [address]     No current facility-administered medications for this  encounter.    Current Outpatient Medications  Medication Sig Dispense Refill  . oxyCODONE-acetaminophen (PERCOCET) 7.5-325 MG per tablet Take 1 tablet by mouth every 6 (six) hours as needed for severe pain. 12 tablet 0  . traMADol (ULTRAM) 50 MG tablet Take 1 tablet (50 mg total) by  mouth every 6 (six) hours as needed. 15 tablet 0    Musculoskeletal: Strength & Muscle Tone: decreased Gait & Station: unsteady Patient leans: N/A  Psychiatric Specialty Exam: Physical Exam  Nursing note and vitals reviewed. Constitutional: He appears well-developed and well-nourished.  HENT:  Head: Normocephalic and atraumatic.  Eyes: Pupils are equal, round, and reactive to light. Conjunctivae are normal.  Neck: Normal range of motion.  Cardiovascular: Normal heart sounds.  Respiratory: Effort normal.  GI: Soft.  Musculoskeletal: Normal range of motion.  Neurological: He is alert.  Patient has significant weakness on his right side with minimal coordinated activity in his right upper extremity all of which is chronic  Skin: Skin is warm and dry.  Psychiatric: His speech is normal and behavior is normal. Thought content normal. His affect is blunt. He expresses impulsivity. He exhibits abnormal recent memory.    Review of Systems  Constitutional: Negative.   HENT: Negative.   Eyes: Negative.   Respiratory: Negative.   Cardiovascular: Negative.   Gastrointestinal: Negative.   Musculoskeletal: Negative.   Skin: Negative.   Neurological: Negative.   Psychiatric/Behavioral: Positive for memory loss and substance abuse. Negative for depression, hallucinations and suicidal ideas. The patient is not nervous/anxious and does not have insomnia.     Blood pressure 135/73, pulse 65, temperature 98.2 F (36.8 C), temperature source Oral, resp. rate 18, height _0  (1.676 m), weight 54.4 kg (120 lb), SpO2 100 %.Body mass index is 19.37 kg/m.  General Appearance: Casual  Eye Contact:  Good  Speech:  Clear and Coherent  Volume:  Normal  Mood:  Irritable  Affect:  Constricted  Thought Process:  Goal Directed  Orientation:  Full (Time, Place, and Person)  Thought Content:  Logical  Suicidal Thoughts:  No  Homicidal Thoughts:  No  Memory:  Immediate;   Fair Recent;   Poor Remote;    Fair  Judgement:  Fair  Insight:  Shallow  Psychomotor Activity:  Decreased  Concentration:  Concentration: Poor  Recall:  [employer] of Knowledge:  Fair  Language:  Fair  Akathisia:  No  Handed:  Left  AIMS (if indicated):     Assets:  Desire for Improvement Housing Social Support  ADL's:  Intact  Cognition:  Impaired,  Mild  Sleep:        Treatment Plan Summary: Plan Patient is now absolutely denying any suicidal ideation.  Not reporting consistent symptoms of depression.  He does have regular medical management through his outpatient primary care doctor.  Patient was counseled about the dangers of drinking especially when he already has an injured brain.  Strongly encouraged to think seriously about stopping drinking.  He will be given referral information to [place].  Patient no longer meets commitment criteria.  Discontinue IVC.  Case reviewed with emergency room doctor and TTS.  Disposition: No evidence of imminent risk to self or others at present.   Patient does not meet criteria for psychiatric inpatient admission. Supportive therapy provided about ongoing stressors. Discussed crisis plan, support from social network, calling 911, coming to the Emergency Department, and calling Suicide Hotline.  Alethia Berthold, MD 06/04/2018 5:55 PM

## 2018-06-04 NOTE — ED Provider Notes (Signed)
-----------------------------------------   1:02 PM on 06/04/2018 -----------------------------------------   Blood pressure 128/83, pulse (!) 54, temperature 98 F (36.7 C), temperature source Oral, resp. rate 18, height 5\' 6"  (1.676 m), weight 54.4 kg (120 lb), SpO2 100 %.  The patient had no acute events since last update.  Calm and cooperative at this time.  Patient at this time evaluated by Dr. Toni Amendlapacs and deemed appropriate for outpatient psychiatric treatment.  Patient without any suicidal or homicidal ideation at this time.  Clinically sober.     Myrna BlazerSchaevitz, Courtny Bennison Matthew, MD 06/04/18 1302

## 2018-06-04 NOTE — ED Provider Notes (Signed)
-----------------------------------------   8:08 AM on 06/04/2018 -----------------------------------------   Blood pressure 119/83, pulse 68, temperature 98 F (36.7 C), temperature source Oral, resp. rate 18, height 5\' 6"  (1.676 m), weight 54.4 kg (120 lb), SpO2 100 %.  The patient had no acute events since last update.  Calm and cooperative at this time.  Disposition is pending Psychiatry/Behavioral Medicine team recommendations.     Myrna BlazerSchaevitz, David Matthew, MD 06/04/18 [phone]

## 2018-08-03 DIAGNOSIS — R69 Illness, unspecified: Secondary | ICD-10-CM | POA: Diagnosis not present

## 2018-08-03 DIAGNOSIS — Z118 Encounter for screening for other infectious and parasitic diseases: Secondary | ICD-10-CM | POA: Diagnosis not present

## 2018-08-20 DIAGNOSIS — Z87891 Personal history of nicotine dependence: Secondary | ICD-10-CM | POA: Diagnosis not present

## 2018-08-20 DIAGNOSIS — R69 Illness, unspecified: Secondary | ICD-10-CM | POA: Diagnosis not present

## 2018-08-20 DIAGNOSIS — M25571 Pain in right ankle and joints of right foot: Secondary | ICD-10-CM | POA: Diagnosis not present

## 2018-08-20 DIAGNOSIS — M79671 Pain in right foot: Secondary | ICD-10-CM | POA: Diagnosis not present

## 2018-08-20 DIAGNOSIS — M25561 Pain in right knee: Secondary | ICD-10-CM | POA: Diagnosis not present

## 2018-08-20 DIAGNOSIS — Z79899 Other long term (current) drug therapy: Secondary | ICD-10-CM | POA: Diagnosis not present

## 2018-08-29 DIAGNOSIS — Z131 Encounter for screening for diabetes mellitus: Secondary | ICD-10-CM | POA: Diagnosis not present

## 2018-08-29 DIAGNOSIS — Z0000 Encounter for general adult medical examination without abnormal findings: Secondary | ICD-10-CM | POA: Diagnosis not present

## 2018-08-29 DIAGNOSIS — Z79899 Other long term (current) drug therapy: Secondary | ICD-10-CM | POA: Diagnosis not present

## 2018-08-29 DIAGNOSIS — Z125 Encounter for screening for malignant neoplasm of prostate: Secondary | ICD-10-CM | POA: Diagnosis not present

## 2018-08-29 DIAGNOSIS — R5383 Other fatigue: Secondary | ICD-10-CM | POA: Diagnosis not present

## 2018-08-29 DIAGNOSIS — R0602 Shortness of breath: Secondary | ICD-10-CM | POA: Diagnosis not present

## 2018-08-29 DIAGNOSIS — M79672 Pain in left foot: Secondary | ICD-10-CM | POA: Diagnosis not present

## 2018-08-29 DIAGNOSIS — M79671 Pain in right foot: Secondary | ICD-10-CM | POA: Diagnosis not present

## 2018-08-29 DIAGNOSIS — R350 Frequency of micturition: Secondary | ICD-10-CM | POA: Diagnosis not present

## 2018-08-29 DIAGNOSIS — Z114 Encounter for screening for human immunodeficiency virus [HIV]: Secondary | ICD-10-CM | POA: Diagnosis not present

## 2018-08-29 DIAGNOSIS — E7800 Pure hypercholesterolemia, unspecified: Secondary | ICD-10-CM | POA: Diagnosis not present

## 2018-08-29 DIAGNOSIS — E559 Vitamin D deficiency, unspecified: Secondary | ICD-10-CM | POA: Diagnosis not present

## 2018-08-29 DIAGNOSIS — R69 Illness, unspecified: Secondary | ICD-10-CM | POA: Diagnosis not present

## 2018-08-29 DIAGNOSIS — D539 Nutritional anemia, unspecified: Secondary | ICD-10-CM | POA: Diagnosis not present

## 2018-08-30 DIAGNOSIS — H04123 Dry eye syndrome of bilateral lacrimal glands: Secondary | ICD-10-CM | POA: Diagnosis not present

## 2018-09-19 ENCOUNTER — Encounter (HOSPITAL_COMMUNITY): Payer: Self-pay | Admitting: Emergency Medicine

## 2018-09-19 ENCOUNTER — Ambulatory Visit (HOSPITAL_COMMUNITY)
Admission: EM | Admit: 2018-09-19 | Discharge: 2018-09-19 | Disposition: A | Discharge status: Home / Self Care | Payer: Medicare HMO | Attending: Family Medicine | Admitting: Family Medicine

## 2018-09-19 DIAGNOSIS — L700 Acne vulgaris: Secondary | ICD-10-CM | POA: Diagnosis not present

## 2018-09-19 MED ORDER — MINOCYCLINE HCL ER 55 MG PO TB24: 1.0000 | ORAL_TABLET | Freq: Every day | ORAL | 0 refills | Status: DC

## 2018-09-19 MED ORDER — CLINDAMYCIN PHOSPHATE 1 % EX SOLN: Freq: Two times a day (BID) | CUTANEOUS | 2 refills | Status: DC

## 2018-09-19 NOTE — ED Triage Notes (Signed)
Pt states when he shaves he breaks out on his face, c/o rash on his face.

## 2018-09-19 NOTE — ED Provider Notes (Signed)
[facility]   [id number] 09/19/18 Arrival Time: 1302  ASSESSMENT & PLAN:  1. Acne vulgaris   Possible pseudofolliculitis barbae component.  Meds ordered this encounter  Medications  . Minocycline HCl 55 MG TB24    Sig: Take 1 tablet (55 mg total) by mouth daily.    Dispense:  90 tablet    Refill:  0  . clindamycin (CLEOCIN T) 1 % external solution    Sig: Apply topically 2 (two) times daily.    Dispense:  30 mL    Refill:  2   Note given stating that he must not shave for the next two weeks. It would benefit him to keep his facial hair trimmed shortly if he must groom facial hair secondary to rehab requirements.  Recommend f/u with dermatologist if not improving with above therapy.  Reviewed expectations re: course of current medical issues. Questions answered. Outlined signs and symptoms indicating need for more acute intervention. Patient verbalized understanding. After Visit Summary given.   SUBJECTIVE:  Adam Anderson is a 47 y.o. male who presents with a skin complaint.   Location: facial; h/o acne; recently taken off of "a big white pill that was working"; had taken this for years; has seen dermatologist in the past; no recent follow up; in rehab facility that requires he shave his facial hair every morning; reports worsening of facial acne since being required to shave closely Current acne problems worse over the past few weeks esp since he has been required to shave Associated pruritis? none Associated pain? none Progression: fluctuating a bit  Drainage? No  Known trigger? as above New soaps/lotions/topicals/detergents/environmental exposures? No Contacts with similar? No Recent travel? No  Other associated symptoms: none Therapies tried thus far: none Arthralgia or myalgia? none Recent illness? none Fever? none No specific aggravating or alleviating factors reported.  ROS: As per HPI.  OBJECTIVE: Vitals:   09/19/18 1328  BP: (!) 144/88    Pulse: 68  Resp: 16  Temp: 97.7 F (36.5 C)  SpO2: 97%    General appearance: alert; no distress Neck: no LAD; supple Lungs: clear to auscultation bilaterally Heart: regular rate and rhythm Extremities: no edema Skin: warm and dry; multiple closed comedomes over face; mostly in beard but some extend to side of face and forehead; no drainage or bleeding; tender to touch Psychological: alert and cooperative; normal mood and affect  No Known Allergies  Past Medical History:  Diagnosis Date  . Stroke [facility])    Social History   Socioeconomic History  . Marital status: Single    Spouse name: Not on file  . Number of children: Not on file  . Years of education: Not on file  . Highest education level: Not on file  Occupational History  . Not on file  Social Needs  . Financial resource strain: Not on file  . Food insecurity:    Worry: Not on file    Inability: Not on file  . Transportation needs:    Medical: Not on file    Non-medical: Not on file  Tobacco Use  . Smoking status: Current Every Day Smoker  . Smokeless tobacco: Never Used  Substance and Sexual Activity  . Alcohol use: Yes  . Drug use: Not on file  . Sexual activity: Not on file  Lifestyle  . Physical activity:    Days per week: Not on file    Minutes per session: Not on file  . Stress: Not on file  Relationships  .  Social connections:    Talks on phone: Not on file    Gets together: Not on file    Attends religious service: Not on file    Active member of club or organization: Not on file    Attends meetings of clubs or organizations: Not on file    Relationship status: Not on file  . Intimate partner violence:    Fear of current or ex partner: Not on file    Emotionally abused: Not on file    Physically abused: Not on file    Forced sexual activity: Not on file  Other Topics Concern  . Not on file  Social History Narrative  . Not on file   No family history on file. History reviewed. No  pertinent surgical history.   Mardella LaymanHagler, Tykel Badie, MD 09/20/18 [phone]

## 2018-12-04 ENCOUNTER — Ambulatory Visit (HOSPITAL_COMMUNITY)
Admission: EM | Admit: 2018-12-04 | Discharge: 2018-12-04 | Disposition: A | Discharge status: Home / Self Care | Payer: Medicare HMO | Attending: Family Medicine | Admitting: Family Medicine

## 2018-12-04 ENCOUNTER — Encounter (HOSPITAL_COMMUNITY): Payer: Self-pay | Admitting: Family Medicine

## 2018-12-04 DIAGNOSIS — L700 Acne vulgaris: Secondary | ICD-10-CM | POA: Diagnosis not present

## 2018-12-04 MED ORDER — MINOCYCLINE HCL ER 55 MG PO TB24: 1.0000 | ORAL_TABLET | Freq: Every day | ORAL | 0 refills | Status: DC

## 2018-12-04 NOTE — ED Triage Notes (Signed)
Pt here for medication refill for acne meds

## 2018-12-05 NOTE — ED Provider Notes (Signed)
  [facility]   [id number] 12/04/18 Arrival Time: 1516  ASSESSMENT & PLAN:  1. Acne vulgaris     Meds ordered this encounter  Medications  . Minocycline HCl 55 MG TB24    Sig: Take 1 tablet (55 mg total) by mouth daily.    Dispense:  90 tablet    Refill:  0   He agrees to schedule f/u with dermatology. Will follow up with PCP or here if worsening or failing to improve as anticipated. Reviewed expectations re: course of current medical issues. Questions answered. Outlined signs and symptoms indicating need for more acute intervention. Patient verbalized understanding. After Visit Summary given.   SUBJECTIVE:  Adam Anderson is a 48 y.o. male who is here for f/u concerning acne. Reports much better on minocycline. Desires refill. Does not desire to continue using Cleocin-T. Has been able to keep hair short around beard and this is helping. Otherwise well. No specific aggravating or alleviating factors reported.  ROS: As per HPI.  OBJECTIVE: Vitals:   12/04/18 1540  BP: 130/66  Pulse: 65  Resp: 18  Temp: 97.9 F (36.6 C)  TempSrc: Oral  SpO2: 100%    General appearance: alert; no distress Lungs: clear to auscultation bilaterally Heart: regular rate and rhythm Extremities: no edema Skin: warm and dry; fewer closed comedomes over face than at his last visit; no drainage or bleeding; tender to touch; no significant scarring Psychological: alert and cooperative; normal mood and affect  No Known Allergies  Past Medical History:  Diagnosis Date  . Stroke [facility])    Social History   Socioeconomic History  . Marital status: Single    Spouse name: Not on file  . Number of children: Not on file  . Years of education: Not on file  . Highest education level: Not on file  Occupational History  . Not on file  Social Needs  . Financial resource strain: Not on file  . Food insecurity:    Worry: Not on file    Inability: Not on file  . Transportation needs:    Medical: Not on file    Non-medical: Not on file  Tobacco Use  . Smoking status: Current Every Day Smoker  . Smokeless tobacco: Never Used  Substance and Sexual Activity  . Alcohol use: Yes  . Drug use: Not on file  . Sexual activity: Not on file  Lifestyle  . Physical activity:    Days per week: Not on file    Minutes per session: Not on file  . Stress: Not on file  Relationships  . Social connections:    Talks on phone: Not on file    Gets together: Not on file    Attends religious service: Not on file    Active member of club or organization: Not on file    Attends meetings of clubs or organizations: Not on file    Relationship status: Not on file  . Intimate partner violence:    Fear of current or ex partner: Not on file    Emotionally abused: Not on file    Physically abused: Not on file    Forced sexual activity: Not on file  Other Topics Concern  . Not on file  Social History Narrative  . Not on file   History reviewed. No pertinent family history. History reviewed. No pertinent surgical history.   Mardella Layman, MD 12/05/18 [phone]

## 2019-01-29 ENCOUNTER — Other Ambulatory Visit: Payer: Self-pay

## 2019-01-29 ENCOUNTER — Encounter (HOSPITAL_COMMUNITY): Payer: Self-pay

## 2019-01-29 ENCOUNTER — Ambulatory Visit (HOSPITAL_COMMUNITY)
Admission: EM | Admit: 2019-01-29 | Discharge: 2019-01-29 | Disposition: A | Discharge status: Home / Self Care | Payer: Medicare HMO | Attending: Family Medicine | Admitting: Family Medicine

## 2019-01-29 DIAGNOSIS — L700 Acne vulgaris: Secondary | ICD-10-CM | POA: Diagnosis not present

## 2019-01-29 MED ORDER — MINOCYCLINE HCL ER 55 MG PO TB24: 1.0000 | ORAL_TABLET | Freq: Every day | ORAL | 0 refills | Status: DC

## 2019-01-29 NOTE — ED Provider Notes (Signed)
  [facility]   [id number] 01/29/19 Arrival Time: 1450  ASSESSMENT & PLAN:  1. Acne vulgaris   Controlled.  Meds ordered this encounter  Medications  . Minocycline HCl 55 MG TB24    Sig: Take 1 tablet (55 mg total) by mouth daily.    Dispense:  90 tablet    Refill:  0   May f/u as needed. Reviewed expectations re: course of current medical issues. Questions answered. Outlined signs and symptoms indicating need for more acute intervention. Patient verbalized understanding. After Visit Summary given.   SUBJECTIVE:  Adam Anderson is a 48 y.o. male with acne vulgaris who presents for a medication refill of his minocycline. Reports acne is doing well with rare exacerbation. Requests refill of minocycline. Otherwise no concerns. Will hopefully be returning to his home town in May. Will schedule f/u there. No specific aggravating or alleviating factors reported.  ROS: As per HPI.  OBJECTIVE: There were no vitals filed for this visit.  General appearance: alert; no distress Lungs: clear to auscultation bilaterally Heart: regular rate and rhythm Extremities: no edema Skin: warm and dry; acne much improved; face overall clear Psychological: alert and cooperative; normal mood and affect  No Known Allergies  Past Medical History:  Diagnosis Date  . Stroke [facility])    Social History   Socioeconomic History  . Marital status: Single    Spouse name: Not on file  . Number of children: Not on file  . Years of education: Not on file  . Highest education level: Not on file  Occupational History  . Not on file  Social Needs  . Financial resource strain: Not on file  . Food insecurity:    Worry: Not on file    Inability: Not on file  . Transportation needs:    Medical: Not on file    Non-medical: Not on file  Tobacco Use  . Smoking status: Current Every Day Smoker  . Smokeless tobacco: Never Used  Substance and Sexual Activity  . Alcohol use: Yes  . Drug use: Not  on file  . Sexual activity: Not on file  Lifestyle  . Physical activity:    Days per week: Not on file    Minutes per session: Not on file  . Stress: Not on file  Relationships  . Social connections:    Talks on phone: Not on file    Gets together: Not on file    Attends religious service: Not on file    Active member of club or organization: Not on file    Attends meetings of clubs or organizations: Not on file    Relationship status: Not on file  . Intimate partner violence:    Fear of current or ex partner: Not on file    Emotionally abused: Not on file    Physically abused: Not on file    Forced sexual activity: Not on file  Other Topics Concern  . Not on file  Social History Narrative  . Not on file    No past surgical history on file.   Mardella Layman, MD 01/29/19 [phone]

## 2019-01-29 NOTE — ED Triage Notes (Signed)
Patient presents to Urgent Care with requests for a medication refill for a rash on his face.

## 2019-08-17 DIAGNOSIS — R69 Illness, unspecified: Secondary | ICD-10-CM | POA: Diagnosis not present

## 2020-06-25 DIAGNOSIS — R69 Illness, unspecified: Secondary | ICD-10-CM | POA: Diagnosis not present

## 2020-06-29 ENCOUNTER — Emergency Department
Admission: EM | Admit: 2020-06-29 | Discharge: 2020-06-29 | Disposition: A | Discharge status: Home / Self Care | Payer: Medicare HMO | Attending: Emergency Medicine | Admitting: Emergency Medicine

## 2020-06-29 DIAGNOSIS — Y939 Activity, unspecified: Secondary | ICD-10-CM | POA: Diagnosis not present

## 2020-06-29 DIAGNOSIS — Z5321 Procedure and treatment not carried out due to patient leaving prior to being seen by health care provider: Secondary | ICD-10-CM | POA: Insufficient documentation

## 2020-06-29 DIAGNOSIS — Y929 Unspecified place or not applicable: Secondary | ICD-10-CM | POA: Diagnosis not present

## 2020-06-29 DIAGNOSIS — S99929A Unspecified injury of unspecified foot, initial encounter: Secondary | ICD-10-CM | POA: Diagnosis present

## 2020-06-29 DIAGNOSIS — X58XXXA Exposure to other specified factors, initial encounter: Secondary | ICD-10-CM | POA: Diagnosis not present

## 2020-06-29 DIAGNOSIS — Y999 Unspecified external cause status: Secondary | ICD-10-CM | POA: Diagnosis not present

## 2020-06-29 NOTE — ED Notes (Addendum)
Pt deciding to go to his pcp for further care of toe injury, pt toe rewrapped by this Clinical research associate, First RN Misty Stanley notified

## 2020-07-01 DIAGNOSIS — T50B95A Adverse effect of other viral vaccines, initial encounter: Secondary | ICD-10-CM | POA: Diagnosis not present

## 2020-07-01 DIAGNOSIS — S97112A Crushing injury of left great toe, initial encounter: Secondary | ICD-10-CM | POA: Diagnosis not present

## 2020-07-01 DIAGNOSIS — J40 Bronchitis, not specified as acute or chronic: Secondary | ICD-10-CM | POA: Diagnosis not present

## 2020-07-01 DIAGNOSIS — R5383 Other fatigue: Secondary | ICD-10-CM | POA: Diagnosis not present

## 2020-10-29 DIAGNOSIS — U071 COVID-19: Secondary | ICD-10-CM | POA: Diagnosis not present

## 2020-10-29 DIAGNOSIS — Z20822 Contact with and (suspected) exposure to covid-19: Secondary | ICD-10-CM | POA: Diagnosis not present

## 2021-04-16 ENCOUNTER — Emergency Department
Admission: EM | Admit: 2021-04-16 | Discharge: 2021-04-16 | Disposition: A | Discharge status: Home / Self Care | Payer: Medicare HMO | Attending: Emergency Medicine | Admitting: Emergency Medicine

## 2021-04-16 ENCOUNTER — Other Ambulatory Visit: Payer: Self-pay

## 2021-04-16 ENCOUNTER — Emergency Department
Admission: EM | Admit: 2021-04-16 | Discharge: 2021-04-16 | Disposition: A | Discharge status: Home / Self Care | Payer: Medicare HMO | Source: Home / Self Care

## 2021-04-16 ENCOUNTER — Encounter: Payer: Self-pay | Admitting: Emergency Medicine

## 2021-04-16 DIAGNOSIS — M79604 Pain in right leg: Secondary | ICD-10-CM | POA: Insufficient documentation

## 2021-04-16 DIAGNOSIS — R202 Paresthesia of skin: Secondary | ICD-10-CM | POA: Diagnosis present

## 2021-04-16 DIAGNOSIS — M79602 Pain in left arm: Secondary | ICD-10-CM | POA: Insufficient documentation

## 2021-04-16 DIAGNOSIS — M79601 Pain in right arm: Secondary | ICD-10-CM | POA: Insufficient documentation

## 2021-04-16 DIAGNOSIS — Z5321 Procedure and treatment not carried out due to patient leaving prior to being seen by health care provider: Secondary | ICD-10-CM | POA: Insufficient documentation

## 2021-04-16 DIAGNOSIS — M79605 Pain in left leg: Secondary | ICD-10-CM | POA: Insufficient documentation

## 2021-04-16 NOTE — ED Notes (Signed)
No answer when called several times from lobby 

## 2021-04-16 NOTE — ED Triage Notes (Signed)
Pt describes as "pulsing pains to arms and legs"; st medicine works intially but it wears off

## 2021-04-16 NOTE — ED Triage Notes (Signed)
Patient ambulatory to triage with steady gait, without difficulty or distress noted; pt reports pain to rt side of body for 3wks and rx meloxicam with no relief

## 2021-04-16 NOTE — ED Notes (Signed)
After completing vs and initial triage note pt reports he is now leaving due to long wait

## 2021-05-03 ENCOUNTER — Emergency Department
Admission: EM | Admit: 2021-05-03 | Discharge: 2021-05-04 | Disposition: A | Discharge status: Home / Self Care | Payer: Medicare HMO | Attending: Emergency Medicine | Admitting: Emergency Medicine

## 2021-05-03 ENCOUNTER — Other Ambulatory Visit: Payer: Self-pay

## 2021-05-03 ENCOUNTER — Encounter: Payer: Self-pay | Admitting: Emergency Medicine

## 2021-05-03 DIAGNOSIS — F17290 Nicotine dependence, other tobacco product, uncomplicated: Secondary | ICD-10-CM | POA: Insufficient documentation

## 2021-05-03 DIAGNOSIS — F600 Paranoid personality disorder: Secondary | ICD-10-CM | POA: Insufficient documentation

## 2021-05-03 DIAGNOSIS — F1510 Other stimulant abuse, uncomplicated: Secondary | ICD-10-CM | POA: Diagnosis present

## 2021-05-03 DIAGNOSIS — F15151 Other stimulant abuse with stimulant-induced psychotic disorder with hallucinations: Secondary | ICD-10-CM | POA: Insufficient documentation

## 2021-05-03 DIAGNOSIS — G8929 Other chronic pain: Secondary | ICD-10-CM | POA: Diagnosis not present

## 2021-05-03 DIAGNOSIS — F15950 Other stimulant use, unspecified with stimulant-induced psychotic disorder with delusions: Secondary | ICD-10-CM | POA: Diagnosis not present

## 2021-05-03 DIAGNOSIS — F15951 Other stimulant use, unspecified with stimulant-induced psychotic disorder with hallucinations: Secondary | ICD-10-CM

## 2021-05-03 LAB — COMPREHENSIVE METABOLIC PANEL
ALT: 23 U/L (ref 0–44)
AST: 62 U/L — ABNORMAL HIGH (ref 15–41)
Albumin: 4.8 g/dL (ref 3.5–5.0)
Alkaline Phosphatase: 74 U/L (ref 38–126)
Anion gap: 9 (ref 5–15)
BUN: 13 mg/dL (ref 6–20)
CO2: 27 mmol/L (ref 22–32)
Calcium: 9.2 mg/dL (ref 8.9–10.3)
Chloride: 99 mmol/L (ref 98–111)
Creatinine, Ser: 0.66 mg/dL (ref 0.61–1.24)
GFR, Estimated: >60 mL/min (ref >60)
Glucose, Bld: 100 mg/dL — ABNORMAL HIGH (ref 70–99)
Potassium: 3.4 mmol/L — ABNORMAL LOW (ref 3.5–5.1)
Sodium: 135 mmol/L (ref 135–145)
Total Bilirubin: 1.6 mg/dL — ABNORMAL HIGH (ref 0.3–1.2)
Total Protein: 7.7 g/dL (ref 6.5–8.1)

## 2021-05-03 LAB — CBC
HCT: 44.7 % (ref 39.0–52.0)
Hemoglobin: 16.5 g/dL (ref 13.0–17.0)
MCH: 33.3 pg (ref 26.0–34.0)
MCHC: 36.9 g/dL — ABNORMAL HIGH (ref 30.0–36.0)
MCV: 90.3 fL (ref 80.0–100.0)
Platelets: 180 10*3/uL (ref 150–400)
RBC: 4.95 MIL/uL (ref 4.22–5.81)
RDW: 12.1 % (ref 11.5–15.5)
WBC: 12 10*3/uL — ABNORMAL HIGH (ref 4.0–10.5)
nRBC: 0 % (ref 0.0–0.2)

## 2021-05-03 LAB — ETHANOL: Alcohol, Ethyl (B): <10 mg/dL (ref <10)

## 2021-05-03 MED ORDER — LORAZEPAM 2 MG PO TABS
2.0000 mg | ORAL_TABLET | ORAL | Status: AC
  Administered 2021-05-03: 2 mg via ORAL
  Filled 2021-05-03: qty 1

## 2021-05-03 MED ORDER — OLANZAPINE 5 MG PO TBDP
5.0000 mg | ORAL_TABLET | ORAL | Status: AC
  Administered 2021-05-03: 5 mg via ORAL
  Filled 2021-05-03: qty 1

## 2021-05-03 NOTE — ED Notes (Signed)
IVC pending placement 

## 2021-05-03 NOTE — ED Notes (Signed)
Pt sleeping. 

## 2021-05-03 NOTE — ED Provider Notes (Signed)
[facility] Emergency Department Provider Note  ____________________________________________  Time seen: Approximately 2:24 PM  I have reviewed the triage vital signs and the nursing notes.   HISTORY  Chief Complaint IVC    HPI Adam Anderson is a 50 y.o. male with a past history of polysubstance abuse, stroke who was brought to the ED today under involuntary commitment due to paranoia and hallucinations.  Patient reports that he used methamphetamine yesterday, and since yesterday evening has been seeing "hunters" in the woods outside of the house.  He reports that his father did not believe that there were people there, so the patient went out to the woods to take pictures.  Sheriff deputies report reviewing his photos and not seeing anything suspicious.  Patient denies any pain or other acute symptoms.  No auditory hallucinations.  No SI or HI.    Past Medical History:  Diagnosis Date   Stroke [facility])      Patient Active Problem List   Diagnosis Date Noted   Alcohol abuse 06/04/2018   Substance induced mood disorder (HCC) 06/04/2018   Somatic symptom disorder 06/04/2018     History reviewed. No pertinent surgical history.   Prior to Admission medications   Medication Sig Start Date End Date Taking? Authorizing Provider  clindamycin (CLEOCIN T) 1 % external solution Apply topically 2 (two) times daily. 09/19/18   Mardella Layman, MD  Minocycline HCl 55 MG TB24 Take 1 tablet (55 mg total) by mouth daily. 01/29/19   Mardella Layman, MD     Allergies Patient has no known allergies.   History reviewed. No pertinent family history.  Social History Social History   Tobacco Use   Smoking status: Every Day    Pack years: 0.00    Types: Cigars   Smokeless tobacco: Never  Vaping Use   Vaping Use: Never used  Substance Use Topics   Alcohol use: Yes    Review of Systems  Constitutional:   No fever or chills.  ENT:   No sore throat. No  rhinorrhea. Cardiovascular:   No chest pain or syncope. Respiratory:   No dyspnea or cough. Gastrointestinal:   Negative for abdominal pain, vomiting and diarrhea.  Musculoskeletal:   Negative for focal pain or swelling All other systems reviewed and are negative except as documented above in ROS and HPI.  ____________________________________________   PHYSICAL EXAM:  VITAL SIGNS: ED Triage Vitals  Enc Vitals Group     BP 05/03/21 1253 (!) 141/92     Pulse Rate 05/03/21 1253 78     Resp 05/03/21 1253 16     Temp 05/03/21 1253 98.3 F (36.8 C)     Temp Source 05/03/21 1253 Oral     SpO2 05/03/21 1253 98 %     Weight 05/03/21 1305 130 lb (59 kg)     Height 05/03/21 1305 5\' 7"  (1.702 m)     Head Circumference --      Peak Flow --      Pain Score 05/03/21 1305 0     Pain Loc --      Pain Edu? --      Excl. in GC? --     Vital signs reviewed, nursing assessments reviewed.   Constitutional:   Alert and oriented. Non-toxic appearance. Eyes:   Conjunctivae are normal. EOMI. PERRL. ENT      Head:   Normocephalic and atraumatic.      Nose:   Wearing a mask.  Mouth/Throat:   Wearing a mask.      Neck:   No meningismus. Full ROM. Hematological/Lymphatic/Immunilogical:   No cervical lymphadenopathy. Cardiovascular:   RRR. Symmetric bilateral radial and DP pulses.  No murmurs. Cap refill less than 2 seconds. Respiratory:   Normal respiratory effort without tachypnea/retractions. Breath sounds are clear and equal bilaterally. No wheezes/rales/rhonchi. Gastrointestinal:   Soft and nontender. Non distended. There is no CVA tenderness.  No rebound, rigidity, or guarding. Genitourinary:   deferred Musculoskeletal:   Normal range of motion in all extremities. No joint effusions.  No lower extremity tenderness.  No edema. Neurologic:   Normal speech and language.  Motor grossly intact. No acute focal neurologic deficits are appreciated.  Skin:    Skin is warm, dry and intact. No  rash noted.  No petechiae, purpura, or bullae.  ____________________________________________    LABS (pertinent positives/negatives) (all labs ordered are listed, but only abnormal results are displayed) Labs Reviewed  COMPREHENSIVE METABOLIC PANEL - Abnormal; Notable for the following components:      Result Value   Potassium 3.4 (*)    Glucose, Bld 100 (*)    AST 62 (*)    Total Bilirubin 1.6 (*)    All other components within normal limits  CBC - Abnormal; Notable for the following components:   WBC 12.0 (*)    MCHC 36.9 (*)    All other components within normal limits  ETHANOL  URINE DRUG SCREEN, QUALITATIVE ([facility] ONLY)   ____________________________________________   EKG    ____________________________________________    RADIOLOGY  No results found.  ____________________________________________   PROCEDURES Procedures  ____________________________________________    CLINICAL IMPRESSION / ASSESSMENT AND PLAN / ED COURSE  Medications ordered in the ED: Medications - No data to display  Pertinent labs & imaging results that were available during my care of the patient were reviewed by me and considered in my medical decision making (see chart for details).  Adam Anderson was evaluated in Emergency Department on 05/03/2021 for the symptoms described in the history of present illness. He was evaluated in the context of the global COVID-19 pandemic, which necessitated consideration that the patient might be at risk for infection with the SARS-CoV-2 virus that causes COVID-19. Institutional protocols and algorithms that pertain to the evaluation of patients at risk for COVID-19 are in a state of rapid change based on information released by regulatory bodies including the CDC and federal and state organizations. These policies and algorithms were followed during the patient's care in the ED.   Patient presents with visual hallucinations and paranoia in the setting  of methamphetamine use.  This appears to be drug-induced psychosis.  Vital signs unremarkable, labs unremarkable.  He is not floridly psychotic or delirious, but insight is still impaired.  We will continue IVC for now pending psychiatry evaluation.  The patient has been placed in psychiatric observation due to the need to provide a safe environment for the patient while obtaining psychiatric consultation and evaluation, as well as ongoing medical and medication management to treat the patient's condition.  The patient has been placed under full IVC at this time.       ____________________________________________   FINAL CLINICAL IMPRESSION(S) / ED DIAGNOSES    Final diagnoses:  Amphetamine and psychostimulant-induced psychosis with hallucinations [facility])     ED Discharge Orders     None       Portions of this note were generated with dragon dictation software. Dictation errors may occur despite  best attempts at proofreading.   Sharman Cheek, MD 05/03/21 [phone]
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
## 2021-05-03 NOTE — ED Notes (Signed)
IVC  MOVED  TO  [facility]  UNIT  CONSULT  DONE
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
## 2021-05-04 LAB — URINE DRUG SCREEN, QUALITATIVE (ARMC ONLY)
Amphetamines, Ur Screen: POSITIVE — AB
Barbiturates, Ur Screen: NOT DETECTED
Benzodiazepine, Ur Scrn: POSITIVE — AB
Cannabinoid 50 Ng, Ur ~~LOC~~: POSITIVE — AB
Cocaine Metabolite,Ur ~~LOC~~: NOT DETECTED
MDMA (Ecstasy)Ur Screen: NOT DETECTED
Methadone Scn, Ur: NOT DETECTED
Opiate, Ur Screen: NOT DETECTED
Phencyclidine (PCP) Ur S: NOT DETECTED
Tricyclic, Ur Screen: NOT DETECTED

## 2021-05-04 MED ORDER — DULOXETINE HCL 20 MG PO CPEP: 20.0000 mg | ORAL_CAPSULE | Freq: Every day | ORAL | 1 refills | Status: AC

## 2021-05-04 NOTE — ED Provider Notes (Signed)
Emergency Medicine Observation Re-evaluation Note  Adam Anderson is a 50 y.o. male, seen on rounds today.  Pt initially presented to the ED for complaints of IVC Currently, the patient is resting comfortably.  Physical Exam  BP 118/86 (BP Location: Left Arm)   Pulse 88   Temp 97.7 F (36.5 C) (Oral)   Resp 16   Ht 5\' 7"  (1.702 m)   Wt 59 kg   SpO2 96%   BMI 20.36 kg/m  Physical Exam General: No acute distress Cardiac: Well-perfused extremities Lungs: No respiratory distress Psych: Appropriate mood and affect  ED Course / MDM  EKG:   I have reviewed the labs performed to date as well as medications administered while in observation.  Recent changes in the last 24 hours include none.  Plan  Current plan is for psychiatric placement. Patient is under full IVC at this time.   , MD 05/04/21 [phone]

## 2021-05-04 NOTE — ED Notes (Signed)
This RN attempted to contact mother and father for ride with no answer.

## 2021-05-04 NOTE — ED Notes (Signed)
Pt given lunch tray.

## 2021-05-04 NOTE — ED Notes (Signed)
E-signature not working at this time. Pt verbalized understanding of D/C instructions, prescriptions and follow up care with no further questions at this time. Pt in NAD and ambulatory at time of D/C. [facility] here to take patient home.

## 2021-05-04 NOTE — ED Notes (Signed)
Asked patient if he wanted to take shower, patient he's not sure for now but will let me know.

## 2021-05-04 NOTE — Consult Note (Signed)
[facility] Face-to-Face Psychiatry Consult   Reason for Consult: Follow-up consult on reassessment for this 50 year old man who presented with psychosis yesterday Referring Physician:  Vicente Males Patient Identification: Adam Anderson MRN:  [id number] Principal Diagnosis: Amphetamine and psychostimulant-induced psychosis with delusions (HCC) Diagnosis:  Principal Problem:   Amphetamine and psychostimulant-induced psychosis with delusions (HCC) Active Problems:   Amphetamine abuse (HCC)   Chronic pain   Total Time spent with patient: 30 minutes  Subjective:   Adam Anderson is a 50 y.o. male patient admitted with "I guess I am okay".  HPI: Follow-up on yesterday's evaluation.  50 year old man who presented initially with paranoia and agitated behavior in the context of the use of methamphetamine.  Patient has slept overnight and on evaluation today states he is no longer feeling paranoid.  Has not had any return of visual hallucinations.  Mood continues to be chronically dysphoric which seems to be related at least in part to his chronic pain on his right side.  Patient denies any suicidal thought or intent.  Denies homicidal ideation.  Currently lucid and cooperative.  Physically appears to be back to baseline.  Past Psychiatric History: Longstanding chronic right-sided pain probably due to a stroke in the central brain.  Patient appears to probably have some chronic cognitive deficits as a result as well.  No past suicide attempts.  Some probable past attempts at treating depression although it does not seem that it has been an active issue recently  Risk to Self:   Risk to Others:   Prior Inpatient Therapy:   Prior Outpatient Therapy:    Past Medical History:  Past Medical History:  Diagnosis Date   Stroke [facility])    History reviewed. No pertinent surgical history. Family History: History reviewed. No pertinent family history. Family Psychiatric  History: See previous Social History:  Social  History   Substance and Sexual Activity  Alcohol Use Yes     Social History   Substance and Sexual Activity  Drug Use Not on file    Social History   Socioeconomic History   Marital status: Single    Spouse name: Not on file   Number of children: Not on file   Years of education: Not on file   Highest education level: Not on file  Occupational History   Not on file  Tobacco Use   Smoking status: Every Day    Pack years: 0.00    Types: Cigars   Smokeless tobacco: Never  Vaping Use   Vaping Use: Never used  Substance and Sexual Activity   Alcohol use: Yes   Drug use: Not on file   Sexual activity: Not on file  Other Topics Concern   Not on file  Social History Narrative   Not on file   Social Determinants of Health   Financial Resource Strain: Not on file  Food Insecurity: Not on file  Transportation Needs: Not on file  Physical Activity: Not on file  Stress: Not on file  Social Connections: Not on file   Additional Social History:    Allergies:  No Known Allergies  Labs:  Results for orders placed or performed during the hospital encounter of 05/03/21 (from the past 48 hour(s))  Comprehensive metabolic panel     Status: Abnormal   Collection Time: 05/03/21 12:58 PM  Result Value Ref Range   Sodium 135 135 - 145 mmol/L   Potassium 3.4 (L) 3.5 - 5.1 mmol/L    Comment: HEMOLYSIS AT THIS LEVEL MAY  AFFECT RESULT   Chloride 99 98 - 111 mmol/L   CO2 27 22 - 32 mmol/L   Glucose, Bld 100 (H) 70 - 99 mg/dL    Comment: Glucose reference range applies only to samples taken after fasting for at least 8 hours.   BUN 13 6 - 20 mg/dL   Creatinine, Ser 2.03 0.61 - 1.24 mg/dL   Calcium 9.2 8.9 - 55.9 mg/dL   Total Protein 7.7 6.5 - 8.1 g/dL   Albumin 4.8 3.5 - 5.0 g/dL   AST 62 (H) 15 - 41 U/L   ALT 23 0 - 44 U/L   Alkaline Phosphatase 74 38 - 126 U/L   Total Bilirubin 1.6 (H) 0.3 - 1.2 mg/dL   GFR, Estimated >74 >16 mL/min    Comment: (NOTE) Calculated using the  CKD-EPI Creatinine Equation (2021)    Anion gap 9 5 - 15    Comment: Performed at [facility], [address] [place], [place] [address]  Ethanol     Status: None   Collection Time: 05/03/21 12:58 PM  Result Value Ref Range   Alcohol, Ethyl (B) <10 <10 mg/dL    Comment: (NOTE) Lowest detectable limit for serum alcohol is 10 mg/dL.  For medical purposes only. Performed at [facility], [address] [place], [place] [address]   cbc     Status: Abnormal   Collection Time: 05/03/21 12:58 PM  Result Value Ref Range   WBC 12.0 (H) 4.0 - 10.5 K/uL   RBC 4.95 4.22 - 5.81 MIL/uL   Hemoglobin 16.5 13.0 - 17.0 g/dL   HCT 32.1 22.4 - 82.5 %   MCV 90.3 80.0 - 100.0 fL   MCH 33.3 26.0 - 34.0 pg   MCHC 36.9 (H) 30.0 - 36.0 g/dL   RDW 00.3 70.4 - 88.8 %   Platelets 180 150 - 400 K/uL   nRBC 0.0 0.0 - 0.2 %    Comment: Performed at [facility], [address] [place], [place] [address]  Urine Drug Screen, Qualitative     Status: Abnormal   Collection Time: 05/04/21  5:26 AM  Result Value Ref Range   Tricyclic, Ur Screen NONE DETECTED NONE DETECTED   Amphetamines, Ur Screen POSITIVE (A) NONE DETECTED   MDMA (Ecstasy)Ur Screen NONE DETECTED NONE DETECTED   Cocaine Metabolite,Ur [place] NONE DETECTED NONE DETECTED   Opiate, Ur Screen NONE DETECTED NONE DETECTED   Phencyclidine (PCP) Ur S NONE DETECTED NONE DETECTED   Cannabinoid 50 Ng, Ur [place] POSITIVE (A) NONE DETECTED   Barbiturates, Ur Screen NONE DETECTED NONE DETECTED   Benzodiazepine, Ur Scrn POSITIVE (A) NONE DETECTED   Methadone Scn, Ur NONE DETECTED NONE DETECTED    Comment: (NOTE) Tricyclics + metabolites, urine    Cutoff 1000 ng/mL Amphetamines + metabolites, urine  Cutoff 1000 ng/mL MDMA (Ecstasy), urine              Cutoff 500 ng/mL Cocaine Metabolite, urine          Cutoff 300 ng/mL Opiate + metabolites, urine        Cutoff 300 ng/mL Phencyclidine (PCP), urine         Cutoff 25  ng/mL Cannabinoid, urine                 Cutoff 50 ng/mL Barbiturates + metabolites, urine  Cutoff 200 ng/mL Benzodiazepine, urine              Cutoff 200 ng/mL Methadone, urine  Cutoff 300 ng/mL  The urine drug screen provides only a preliminary, unconfirmed analytical test result and should not be used for non-medical purposes. Clinical consideration and professional judgment should be applied to any positive drug screen result due to possible interfering substances. A more specific alternate chemical method must be used in order to obtain a confirmed analytical result. Gas chromatography / mass spectrometry (GC/MS) is the preferred confirm atory method. Performed at [facility], [address] [place], [place] [address]     No current facility-administered medications for this encounter.   Current Outpatient Medications  Medication Sig Dispense Refill   DULoxetine (CYMBALTA) 20 MG capsule Take 1 capsule (20 mg total) by mouth daily. 60 capsule 1   diclofenac (VOLTAREN) 75 MG EC tablet Take 75 mg by mouth 2 (two) times daily.     meloxicam (MOBIC) 7.5 MG tablet Take 7.5 mg by mouth 2 (two) times daily.      Musculoskeletal: Strength & Muscle Tone: decreased Gait & Station: ataxic Patient leans: N/A            Psychiatric Specialty Exam:  Presentation  General Appearance:  No data recorded Eye Contact: No data recorded Speech: No data recorded Speech Volume: No data recorded Handedness: No data recorded  Mood and Affect  Mood: No data recorded Affect: No data recorded  Thought Process  Thought Processes: No data recorded Descriptions of Associations:No data recorded Orientation:No data recorded Thought Content:No data recorded History of Schizophrenia/Schizoaffective disorder:No data recorded Duration of Psychotic Symptoms:No data recorded Hallucinations:No data recorded Ideas of Reference:No data recorded Suicidal  Thoughts:No data recorded Homicidal Thoughts:No data recorded  Sensorium  Memory: No data recorded Judgment: No data recorded Insight: No data recorded  Executive Functions  Concentration: No data recorded Attention Span: No data recorded Recall: No data recorded Fund of Knowledge: No data recorded Language: No data recorded  Psychomotor Activity  Psychomotor Activity: No data recorded  Assets  Assets: No data recorded  Sleep  Sleep: No data recorded  Physical Exam: Physical Exam Vitals and nursing note reviewed.  Constitutional:      Appearance: Normal appearance.  HENT:     Head: Normocephalic and atraumatic.     Mouth/Throat:     Pharynx: Oropharynx is clear.  Eyes:     Pupils: Pupils are equal, round, and reactive to light.  Cardiovascular:     Rate and Rhythm: Normal rate and regular rhythm.  Pulmonary:     Effort: Pulmonary effort is normal.     Breath sounds: Normal breath sounds.  Abdominal:     General: Abdomen is flat.     Palpations: Abdomen is soft.  Musculoskeletal:        General: Normal range of motion.  Skin:    General: Skin is warm and dry.  Neurological:     General: No focal deficit present.     Mental Status: He is alert. Mental status is at baseline.  Psychiatric:        Attention and Perception: Attention normal.        Mood and Affect: Mood normal. Affect is blunt.        Speech: Speech is delayed.        Behavior: Behavior is slowed.        Thought Content: Thought content normal. Thought content is not paranoid. Thought content does not include suicidal ideation.        Cognition and Memory: Cognition is impaired.   Review of Systems  Constitutional: Negative.   HENT: Negative.    Eyes: Negative.   Respiratory: Negative.    Cardiovascular: Negative.   Gastrointestinal: Negative.   Musculoskeletal:  Positive for myalgias.  Skin: Negative.   Neurological:  Positive for sensory change.  Psychiatric/Behavioral:   Positive for depression. Negative for hallucinations and suicidal ideas. The patient is not nervous/anxious and does not have insomnia.   Blood pressure 118/86, pulse 88, temperature 97.7 F (36.5 C), temperature source Oral, resp. rate 16, height 5\' 7"  (1.702 m), weight 59 kg, SpO2 96 %. Body mass index is 20.36 kg/m.  Treatment Plan Summary: Medication management and Plan patient no longer meets commitment criteria.  Discontinue involuntary commitment.  Does not require inpatient psychiatric treatment.  Spoke with him a little about his chronic dysphoria and his chronic pain.  Patient seems to have somewhat given up on the chance of things getting better in terms of his chronic pain.  He has seen [facility] but is felt that some of the medicines they gave him were unhelpful.  I suggested to him that there were still things that can be done to help with this kind of chronic pain.  Recommend starting modest dose of duloxetine 20 mg twice a day.  Patient agreeable prescription provided.  Patient strongly encouraged to follow up with his outpatient provider and also strongly encouraged to avoid amphetamines or other abusable drugs.  He agrees to this.  Case reviewed with ER physician and TTS.  Disposition: No evidence of imminent risk to self or others at present.   Patient does not meet criteria for psychiatric inpatient admission. Supportive therapy provided about ongoing stressors. Discussed crisis plan, support from social network, calling 911, coming to the Emergency Department, and calling Suicide Hotline.  [place], MD 05/04/2021 1:17 PM
